# Patient Record
Sex: Female | Born: 1980 | Hispanic: Yes | Marital: Single | Smoking: Never smoker
Health system: Southern US, Community
[De-identification: ages and names within clinical notes are randomized; demographics above are authoritative.]

## PROBLEM LIST (undated history)

## (undated) HISTORY — PX: TUBAL LIGATION: SHX77

---
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
## 1999-01-02 ENCOUNTER — Encounter: Payer: Self-pay | Admitting: Obstetrics & Gynecology

## 2000-04-20 ENCOUNTER — Emergency Department (HOSPITAL_COMMUNITY): Admission: EM | Admit: 2000-04-20 | Discharge: 2000-04-21 | Payer: Self-pay | Admitting: *Deleted

## 2001-02-11 ENCOUNTER — Emergency Department (HOSPITAL_COMMUNITY): Admission: EM | Admit: 2001-02-11 | Discharge: 2001-02-11 | Payer: Self-pay | Admitting: *Deleted

## 2001-06-06 ENCOUNTER — Encounter: Payer: Self-pay | Admitting: Emergency Medicine

## 2001-06-06 ENCOUNTER — Emergency Department (HOSPITAL_COMMUNITY): Admission: EM | Admit: 2001-06-06 | Discharge: 2001-06-06 | Payer: Self-pay | Admitting: Emergency Medicine

## 2003-04-27 ENCOUNTER — Inpatient Hospital Stay (HOSPITAL_COMMUNITY): Admission: AD | Admit: 2003-04-27 | Discharge: 2003-04-27 | Payer: Self-pay | Admitting: Obstetrics & Gynecology

## 2009-05-20 ENCOUNTER — Emergency Department (HOSPITAL_COMMUNITY): Admission: EM | Admit: 2009-05-20 | Discharge: 2009-05-20 | Payer: Self-pay | Admitting: Family Medicine

## 2009-05-22 ENCOUNTER — Emergency Department (HOSPITAL_COMMUNITY): Admission: EM | Admit: 2009-05-22 | Discharge: 2009-05-22 | Payer: Self-pay | Admitting: Emergency Medicine

## 2009-10-27 ENCOUNTER — Emergency Department (HOSPITAL_COMMUNITY): Admission: EM | Admit: 2009-10-27 | Discharge: 2009-10-27 | Payer: Self-pay | Admitting: Family Medicine

## 2009-10-27 ENCOUNTER — Emergency Department (HOSPITAL_COMMUNITY): Admission: EM | Admit: 2009-10-27 | Discharge: 2009-10-27 | Payer: Self-pay | Admitting: Emergency Medicine

## 2009-10-28 ENCOUNTER — Encounter (INDEPENDENT_AMBULATORY_CARE_PROVIDER_SITE_OTHER): Payer: Self-pay | Admitting: *Deleted

## 2009-10-28 ENCOUNTER — Telehealth: Payer: Self-pay | Admitting: Internal Medicine

## 2009-10-29 ENCOUNTER — Emergency Department (HOSPITAL_COMMUNITY): Admission: EM | Admit: 2009-10-29 | Discharge: 2009-10-29 | Payer: Self-pay | Admitting: Emergency Medicine

## 2010-06-03 NOTE — Progress Notes (Signed)
Summary: Sooner appt  Phone Note Call from Patient Call back at [phone] CELL   Caller: Mom Summary of Call: pt was seen in [facility] ER last night for abdominal pain and vomiting blood. Pt was released.  Mother and pt are asking for sooner appt than Dr. Lamar Sprinkles first available on 12/02/09. Initial call taken by: Francee Piccolo CMA Duncan Dull),  October 28, 2009 9:43 AM  Follow-up for Phone Call        LM for pt to call.  Lupita Leash Surface RN  October 28, 2009 2:52 PM  Pt. contacted and she said she cx. appt. and is seeing another Dr. Follow-up by: Teryl Lucy RN,  October 29, 2009 8:44 AM

## 2010-06-03 NOTE — Letter (Signed)
Summary: New Patient letter  [facility]  [address] [place], [place] [address]   Phone: [phone]  Fax: [phone]       10/28/2009 MRN: [id number]  [facility] [address] [place], [place]  [address]  Dear Shannon Marks,  Welcome to the Gastroenterology Division at [facility].    You are scheduled to see Dr.  Marina Goodell on December 02, 2009 at 2:45 pm on the 3rd floor at [facility], [address]  We ask that you try to arrive at our office 15 minutes prior to your appointment time to allow for check-in.  We would like you to complete the enclosed self-administered evaluation form prior to your visit and bring it with you on the day of your appointment.  We will review it with you.  Also, please bring a complete list of all your medications or, if you prefer, bring the medication bottles and we will list them.  Please bring your insurance card so that we may make a copy of it.  If your insurance requires a referral to see a specialist, please bring your referral form from your primary care physician.  Co-payments are due at the time of your visit and may be paid by cash, check or credit card.     Your office visit will consist of a consult with your physician (includes a physical exam), any laboratory testing he/she may order, scheduling of any necessary diagnostic testing (e.g. x-ray, ultrasound, CT-scan), and scheduling of a procedure (e.g. Endoscopy, Colonoscopy) if required.  Please allow enough time on your schedule to allow for any/all of these possibilities.    If you cannot keep your appointment, please call [phone] to cancel or reschedule prior to your appointment date.  This allows [place] the opportunity to schedule an appointment for another patient in need of care.  If you do not cancel or reschedule by 5 p.m. the business day prior to your appointment date, you will be charged a $50.00 late cancellation/no-show fee.    Thank you for  choosing [facility] for your medical needs.  We appreciate the opportunity to care for you.  Please visit [place] at our website  to learn more about our practice.                     Sincerely,                                                             The Gastroenterology Division

## 2010-06-08 ENCOUNTER — Inpatient Hospital Stay (INDEPENDENT_AMBULATORY_CARE_PROVIDER_SITE_OTHER)
Admission: RE | Admit: 2010-06-08 | Discharge: 2010-06-08 | Disposition: A | Discharge status: Home / Self Care | Payer: Self-pay | Source: Ambulatory Visit | Attending: Emergency Medicine | Admitting: Emergency Medicine

## 2010-06-08 DIAGNOSIS — J019 Acute sinusitis, unspecified: Secondary | ICD-10-CM

## 2010-06-08 LAB — POCT RAPID STREP A (OFFICE): Streptococcus, Group A Screen (Direct): NEGATIVE

## 2010-06-11 ENCOUNTER — Inpatient Hospital Stay (INDEPENDENT_AMBULATORY_CARE_PROVIDER_SITE_OTHER)
Admission: RE | Admit: 2010-06-11 | Discharge: 2010-06-11 | Disposition: A | Discharge status: Home / Self Care | Payer: Self-pay | Source: Ambulatory Visit

## 2010-06-11 ENCOUNTER — Ambulatory Visit (INDEPENDENT_AMBULATORY_CARE_PROVIDER_SITE_OTHER): Payer: Self-pay

## 2010-06-11 DIAGNOSIS — J019 Acute sinusitis, unspecified: Secondary | ICD-10-CM

## 2010-07-02 ENCOUNTER — Inpatient Hospital Stay (INDEPENDENT_AMBULATORY_CARE_PROVIDER_SITE_OTHER)
Admission: RE | Admit: 2010-07-02 | Discharge: 2010-07-02 | Disposition: A | Discharge status: Home / Self Care | Payer: Self-pay | Source: Ambulatory Visit | Attending: Family Medicine | Admitting: Family Medicine

## 2010-07-02 ENCOUNTER — Emergency Department (HOSPITAL_COMMUNITY)
Admission: EM | Admit: 2010-07-02 | Discharge: 2010-07-02 | Disposition: A | Discharge status: Home / Self Care | Payer: Self-pay | Attending: Emergency Medicine | Admitting: Emergency Medicine

## 2010-07-02 ENCOUNTER — Emergency Department (HOSPITAL_COMMUNITY): Payer: Self-pay

## 2010-07-02 DIAGNOSIS — R221 Localized swelling, mass and lump, neck: Secondary | ICD-10-CM | POA: Insufficient documentation

## 2010-07-02 DIAGNOSIS — R220 Localized swelling, mass and lump, head: Secondary | ICD-10-CM | POA: Insufficient documentation

## 2010-07-02 DIAGNOSIS — R51 Headache: Secondary | ICD-10-CM | POA: Insufficient documentation

## 2010-07-02 DIAGNOSIS — J3489 Other specified disorders of nose and nasal sinuses: Secondary | ICD-10-CM | POA: Insufficient documentation

## 2010-07-18 LAB — COMPREHENSIVE METABOLIC PANEL
ALT: 27 U/L (ref 0–35)
Albumin: 4.3 g/dL (ref 3.5–5.2)
Alkaline Phosphatase: 51 U/L (ref 39–117)
BUN: 7 mg/dL (ref 6–23)
Calcium: 9.2 mg/dL (ref 8.4–10.5)
Creatinine, Ser: 0.72 mg/dL (ref 0.4–1.2)
Glucose, Bld: 93 mg/dL (ref 70–99)
Potassium: 3.8 mEq/L (ref 3.5–5.1)
Sodium: 138 mEq/L (ref 135–145)
Total Protein: 6.7 g/dL (ref 6.0–8.3)
Total Protein: 6.6 g/dL (ref 6.0–8.3)

## 2010-07-18 LAB — DIFFERENTIAL
Basophils Relative: 0 % (ref 0–1)
Eosinophils Absolute: 0 10*3/uL (ref 0.0–0.7)
Lymphocytes Relative: 30 % (ref 12–46)
Monocytes Absolute: 0.5 10*3/uL (ref 0.1–1.0)
Monocytes Absolute: 0.4 10*3/uL (ref 0.1–1.0)
Monocytes Relative: 7 % (ref 3–12)
Monocytes Relative: 5 % (ref 3–12)
Neutro Abs: 5.2 10*3/uL (ref 1.7–7.7)
Neutrophils Relative %: 80 % — ABNORMAL HIGH (ref 43–77)

## 2010-07-18 LAB — URINALYSIS, ROUTINE W REFLEX MICROSCOPIC
Ketones, ur: NEGATIVE mg/dL
Protein, ur: NEGATIVE mg/dL
Urobilinogen, UA: 0.2 mg/dL (ref 0.0–1.0)

## 2010-07-18 LAB — CBC
MCH: 31.5 pg (ref 26.0–34.0)
MCH: 31.8 pg (ref 26.0–34.0)
MCHC: 34.6 g/dL (ref 30.0–36.0)
MCHC: 34.4 g/dL (ref 30.0–36.0)
MCV: 91.1 fL (ref 78.0–100.0)
MCV: 92.3 fL (ref 78.0–100.0)
Platelets: 178 10*3/uL (ref 150–400)
RBC: 4.1 MIL/uL (ref 3.87–5.11)
RDW: 13.1 % (ref 11.5–15.5)
WBC: 8.2 10*3/uL (ref 4.0–10.5)

## 2010-07-18 LAB — HEMOCCULT GUIAC POC 1CARD (OFFICE): Fecal Occult Bld: NEGATIVE

## 2011-08-03 ENCOUNTER — Emergency Department (INDEPENDENT_AMBULATORY_CARE_PROVIDER_SITE_OTHER)
Admission: EM | Admit: 2011-08-03 | Discharge: 2011-08-03 | Disposition: A | Discharge status: Home / Self Care | Payer: Self-pay | Source: Home / Self Care | Attending: Emergency Medicine | Admitting: Emergency Medicine

## 2011-08-03 ENCOUNTER — Encounter (HOSPITAL_COMMUNITY): Payer: Self-pay

## 2011-08-03 DIAGNOSIS — H6120 Impacted cerumen, unspecified ear: Secondary | ICD-10-CM

## 2011-08-03 MED ORDER — DOCUSATE SODIUM 50 MG/5ML PO LIQD: 50.0000 mg | Freq: Once | ORAL | Status: DC

## 2011-08-03 NOTE — Discharge Instructions (Signed)
Go to www.goodrx.com to look up your medications. This will give you a list of where you can find your prescriptions at the most affordable prices.   RESOURCE GUIDE  No Primary Care Doctor Call [facility]  [phone] Other agencies that provide inexpensive medical care    [facility]  [phone]    [facility]  [phone]    [facility]  [phone]    [facility]  [phone] [place] [address]    [facility]  [phone]    [facility]  [phone] [facility] [phone]   [address] , [place] [address]  [place] [facility]  [facility] [place]     [employer]                          [facility] [address]                        [address]      [address]   [phone] (After Hours)   

## 2011-08-03 NOTE — ED Notes (Signed)
C/o hearing muffled right ear; on inspection, right eardrum obscured w cerumen, , left eardrum partly obscured; pt reportedly uses q-tips to clean ears, and had tried to clean w OTC ; phone order by MD to proceed w cleaning

## 2011-08-03 NOTE — ED Provider Notes (Signed)
History     CSN: [id number]  Arrival date & time 08/03/11  1024   First MD Initiated Contact with Patient 08/03/11 1109      Chief Complaint  Patient presents with  . Ear Fullness    (Consider location/radiation/quality/duration/timing/severity/associated sxs/prior treatment) HPI Comments: Patient reports bilaterally ear fullness, decreased hearing right ear starting several days ago. States she's been using Q-tips to try and clean her ears, but that this made the symptoms worse. Patient also tried peroxide and Debrox yesterday without improvement. Denies any other foreign body insertion. No otorrhea, nausea, vomiting, fevers, headache, sore throat, rash.  ROS as noted in HPI. All other ROS negative.   Patient is a 31 y.o. female presenting with plugged ear sensation. The history is provided by the patient. No language interpreter was used.  Ear Fullness This is a new problem. The current episode started more than 2 days ago. The problem occurs constantly. The problem has not changed since onset.Pertinent negatives include no headaches. The symptoms are aggravated by nothing. Treatments tried: Debrox. The treatment provided no relief.    History reviewed. No pertinent past medical history.  Past Surgical History  Procedure Date  . Tubal ligation     History reviewed. No pertinent family history.  History  Substance Use Topics  . Smoking status: Never Smoker   . Smokeless tobacco: Not on file  . Alcohol Use: No    OB History    Grav Para Term Preterm Abortions TAB SAB Ect Mult Living                  Review of Systems  Neurological: Negative for headaches.    Allergies  Review of patient's allergies indicates no known allergies.  Home Medications   Current Outpatient Rx  Name Route Sig Dispense Refill  . CARBAMIDE PEROXIDE 6.5 % OT SOLN  5 drops 2 (two) times daily.      BP 126/85  Pulse 62  Temp(Src) 99 F (37.2 C) (Oral)  Resp 10  SpO2 97%  Physical  Exam  Nursing note and vitals reviewed. Constitutional: She is oriented to person, place, and time. She appears well-developed and well-nourished. No distress.  HENT:  Head: Normocephalic and atraumatic.  Right Ear: Hearing, tympanic membrane, external ear and ear canal normal.  Left Ear: Hearing, tympanic membrane, external ear and ear canal normal.  Eyes: Conjunctivae and EOM are normal.  Neck: Normal range of motion.  Cardiovascular: Normal rate.   Pulmonary/Chest: Effort normal.  Abdominal: She exhibits no distension.  Musculoskeletal: Normal range of motion.  Neurological: She is alert and oriented to person, place, and time.  Skin: Skin is warm and dry.  Psychiatric: She has a normal mood and affect. Her behavior is normal. Judgment and thought content normal.    ED Course  Procedures (including critical care time)  Labs Reviewed - No data to display No results found.   1. Cerumen impaction       MDM  On reevaluation after irrigation, TMs intact, clear. External ear canal slightly irritated, but no significant sign of trauma or infection.  Luiz Blare, MD 08/03/11 1320

## 2011-09-23 ENCOUNTER — Encounter (HOSPITAL_COMMUNITY): Payer: Self-pay | Admitting: *Deleted

## 2011-09-23 ENCOUNTER — Emergency Department (INDEPENDENT_AMBULATORY_CARE_PROVIDER_SITE_OTHER)
Admission: EM | Admit: 2011-09-23 | Discharge: 2011-09-23 | Disposition: A | Discharge status: Home / Self Care | Payer: Self-pay | Source: Home / Self Care | Attending: Family Medicine | Admitting: Family Medicine

## 2011-09-23 DIAGNOSIS — H60399 Other infective otitis externa, unspecified ear: Secondary | ICD-10-CM

## 2011-09-23 DIAGNOSIS — H6093 Unspecified otitis externa, bilateral: Secondary | ICD-10-CM

## 2011-09-23 MED ORDER — CIPROFLOXACIN-HYDROCORTISONE 0.2-1 % OT SUSP: 3.0000 [drp] | Freq: Two times a day (BID) | OTIC | Status: AC

## 2011-09-23 MED ORDER — CEPHALEXIN 500 MG PO CAPS: 500.0000 mg | ORAL_CAPSULE | Freq: Four times a day (QID) | ORAL | Status: AC

## 2011-09-23 MED ORDER — HYDROCODONE-ACETAMINOPHEN 5-325 MG PO TABS: 1.0000 | ORAL_TABLET | ORAL | Status: AC | PRN

## 2011-09-23 NOTE — ED Provider Notes (Signed)
History     CSN: [id number]  Arrival date & time 09/23/11  0905   First MD Initiated Contact with Patient 09/23/11 [phone]      Chief Complaint  Patient presents with  . Otalgia    (Consider location/radiation/quality/duration/timing/severity/associated sxs/prior treatment) Patient is a 31 y.o. female presenting with ear pain. The history is provided by the patient.  Otalgia This is a new problem. The current episode started yesterday. There is pain in both ears. The problem has been gradually worsening. There has been no fever. The pain is moderate. Pertinent negatives include no ear discharge, no hearing loss and no rhinorrhea.    History reviewed. No pertinent past medical history.  Past Surgical History  Procedure Date  . Tubal ligation     History reviewed. No pertinent family history.  History  Substance Use Topics  . Smoking status: Never Smoker   . Smokeless tobacco: Not on file  . Alcohol Use: No    OB History    Grav Para Term Preterm Abortions TAB SAB Ect Mult Living                  Review of Systems  Constitutional: Negative.   HENT: Positive for ear pain. Negative for hearing loss, rhinorrhea, sneezing, postnasal drip, tinnitus and ear discharge.     Allergies  Review of patient's allergies indicates no known allergies.  Home Medications   Current Outpatient Rx  Name Route Sig Dispense Refill  . CARBAMIDE PEROXIDE 6.5 % OT SOLN  5 drops 2 (two) times daily.    . CEPHALEXIN 500 MG PO CAPS Oral Take 1 capsule (500 mg total) by mouth 4 (four) times daily. Take all of medicine and drink lots of fluids 20 capsule 0  . CIPROFLOXACIN-HYDROCORTISONE 0.2-1 % OT SUSP Both Ears Place 3 drops into both ears 2 (two) times daily. 10 mL 0  . HYDROCODONE-ACETAMINOPHEN 5-325 MG PO TABS Oral Take 1 tablet by mouth every 4 (four) hours as needed for pain. 6 tablet 0    BP 123/85  Pulse 62  Temp 98.2 F (36.8 C)  Resp 18  SpO2 100%  LMP 09/21/2011  Physical  Exam  Nursing note and vitals reviewed. Constitutional: She appears well-developed and well-nourished.  HENT:  Head: Normocephalic.  Right Ear: Tympanic membrane normal. There is tenderness. No drainage. Tympanic membrane is not injected.  Left Ear: Tympanic membrane normal. There is tenderness. No drainage. Tympanic membrane is not injected.  Nose: Nose normal.  Mouth/Throat: Oropharynx is clear and moist.    ED Course  Procedures (including critical care time)  Labs Reviewed - No data to display No results found.   1. Otitis externa of both ears       MDM          Linna Hoff, MD 09/23/11 [phone]

## 2011-09-23 NOTE — ED Notes (Signed)
Pt  Reports  l  Earache  Which  Started  Yesterday   She  Reports  The  Pain is  Causing  Her  A  Headache     She  denys  Any other  Symptoms

## 2012-03-26 ENCOUNTER — Encounter (HOSPITAL_COMMUNITY): Payer: Self-pay | Admitting: *Deleted

## 2012-03-26 ENCOUNTER — Emergency Department (INDEPENDENT_AMBULATORY_CARE_PROVIDER_SITE_OTHER)
Admission: EM | Admit: 2012-03-26 | Discharge: 2012-03-26 | Disposition: A | Discharge status: Home / Self Care | Payer: Self-pay | Source: Home / Self Care | Attending: Emergency Medicine | Admitting: Emergency Medicine

## 2012-03-26 DIAGNOSIS — J039 Acute tonsillitis, unspecified: Secondary | ICD-10-CM

## 2012-03-26 DIAGNOSIS — J0300 Acute streptococcal tonsillitis, unspecified: Secondary | ICD-10-CM

## 2012-03-26 DIAGNOSIS — J020 Streptococcal pharyngitis: Secondary | ICD-10-CM

## 2012-03-26 LAB — POCT RAPID STREP A: Streptococcus, Group A Screen (Direct): POSITIVE — AB

## 2012-03-26 MED ORDER — ACETAMINOPHEN-CODEINE #4 300-60 MG PO TABS: 1.0000 | ORAL_TABLET | ORAL | Status: DC | PRN

## 2012-03-26 MED ORDER — AMOXICILLIN 500 MG PO CAPS: 500.0000 mg | ORAL_CAPSULE | Freq: Three times a day (TID) | ORAL | Status: AC

## 2012-03-26 NOTE — ED Provider Notes (Signed)
History     CSN: [id number]  Arrival date & time 03/26/12  0804   First MD Initiated Contact with Patient 03/26/12 [phone]      Chief Complaint  Patient presents with  . Sore Throat    (Consider location/radiation/quality/duration/timing/severity/associated sxs/prior treatment) HPI Comments: Patient presents urgent care this morning complaining of severe sore throat. She feels her glands on her neck are swollen and tender at touch. She had a fever yesterday of 101. She has multiple white spots in the back of her throat and she reports seen at on a mirror. She works at [employer] and is exposed to multiple clients she describes. He feels somewhat without appetite, with discomforting sore throat. Patient denies any coughing or nasal congestion. Denies any abdominal pain nausea or vomiting. No newly developed rashes.  Patient is a 31 y.o. female presenting with pharyngitis. The history is provided by the patient.  Sore Throat This is a new problem. The problem occurs constantly. The problem has been gradually worsening. Pertinent negatives include no headaches and no shortness of breath. The symptoms are aggravated by swallowing. Nothing relieves the symptoms. The treatment provided no relief.    No past medical history on file.  Past Surgical History  Procedure Date  . Tubal ligation     No family history on file.  History  Substance Use Topics  . Smoking status: Never Smoker   . Smokeless tobacco: Not on file  . Alcohol Use: No    OB History    Grav Para Term Preterm Abortions TAB SAB Ect Mult Living                  Review of Systems  Constitutional: Positive for fever, chills, activity change and fatigue.  HENT: Positive for ear pain, sore throat and neck pain. Negative for congestion, facial swelling, rhinorrhea, drooling, mouth sores, neck stiffness and tinnitus.   Respiratory: Negative for cough, shortness of breath and wheezing.   Musculoskeletal: Negative for back  pain and arthralgias.  Skin: Negative for rash.  Neurological: Negative for dizziness and headaches.    Allergies  Review of patient's allergies indicates no known allergies.  Home Medications   Current Outpatient Rx  Name  Route  Sig  Dispense  Refill  . CARBAMIDE PEROXIDE 6.5 % OT SOLN      5 drops 2 (two) times daily.           BP 118/72  Pulse 80  Temp 99.7 F (37.6 C) (Oral)  Resp 16  SpO2 99%  Physical Exam  Nursing note and vitals reviewed. Constitutional: She appears distressed.  HENT:  Head: Normocephalic.  Right Ear: Hearing and tympanic membrane normal. No drainage. No mastoid tenderness. Tympanic membrane is not injected, not perforated, not erythematous and not retracted.  Left Ear: Hearing and tympanic membrane normal. No drainage. No mastoid tenderness. Tympanic membrane is not injected, not perforated, not erythematous and not retracted.  Mouth/Throat: Uvula is midline and mucous membranes are normal. Oropharyngeal exudate and posterior oropharyngeal erythema present. No tonsillar abscesses.  Eyes: Conjunctivae normal are normal.  Neck: Neck supple. No JVD present. No tracheal deviation present. No thyromegaly present.  Cardiovascular: Normal rate.  Exam reveals no gallop.   No murmur heard. Pulmonary/Chest: Effort normal and breath sounds normal.  Lymphadenopathy:    She has cervical adenopathy.  Skin: Skin is warm. No rash noted.    ED Course  Procedures (including critical care time)   Labs Reviewed  POCT RAPID STREP  A ([facility] ONLY)   No results found.   No diagnosis found.    MDM   Tonsillitis. Pustular appearance. Marked reactive lymphadenopathy is. We are performing rapid strep will treat with antibiotics. Patient will be provided with a prescription of amoxicillin for 10 days and 15 tablets of Tylenol #3.       Jimmie Molly, MD 03/26/12 [phone]

## 2012-03-26 NOTE — ED Notes (Signed)
Pt  Reports  Symptoms  Of  sorethroat           And  Bilateral  Earache   Symptoms     X  sev  Days    Pt  Reports  Her  Lymph  Nodes    Seem  Swollen                She  Appears  Not  To  Feel  Well

## 2013-12-11 ENCOUNTER — Encounter (HOSPITAL_COMMUNITY): Payer: Self-pay | Admitting: Emergency Medicine

## 2013-12-11 ENCOUNTER — Emergency Department (INDEPENDENT_AMBULATORY_CARE_PROVIDER_SITE_OTHER)
Admission: EM | Admit: 2013-12-11 | Discharge: 2013-12-11 | Disposition: A | Discharge status: Home / Self Care | Payer: Self-pay | Source: Home / Self Care | Attending: Family Medicine | Admitting: Family Medicine

## 2013-12-11 DIAGNOSIS — K0501 Acute gingivitis, non-plaque induced: Secondary | ICD-10-CM

## 2013-12-11 MED ORDER — TRIAMCINOLONE ACETONIDE 0.1 % MT PSTE: PASTE | OROMUCOSAL | Status: DC

## 2013-12-11 MED ORDER — CHLORHEXIDINE GLUCONATE 0.12 % MT SOLN: 15.0000 mL | Freq: Four times a day (QID) | OROMUCOSAL | Status: DC

## 2013-12-11 NOTE — ED Notes (Signed)
Pt c/o mouth lesions since last night Pain is 10/10 Alert w/no signs of acute distress.

## 2013-12-11 NOTE — Discharge Instructions (Signed)
See your dentist if further problems. °

## 2013-12-11 NOTE — ED Provider Notes (Signed)
CSN: [id number]     Arrival date & time 12/11/13  1829 History   First MD Initiated Contact with Patient 12/11/13 1919     Chief Complaint  Patient presents with  . Mouth Lesions   (Consider location/radiation/quality/duration/timing/severity/associated sxs/prior Treatment) Patient is a 33 y.o. female presenting with mouth sores. The history is provided by the patient.  Mouth Lesions Location:  Upper gingiva and lower gingiva Upper gingiva location:  L buccal and R buccal Lower gingiva location:  L buccal and R buccal Quality:  Blistered, painful and ulcerous Pain details:    Quality:  Sore   Severity:  Mild   Duration:  1 day   Progression:  Worsening Onset quality:  Sudden Severity:  Moderate Chronicity:  New Relieved by:  None tried Worsened by:  Nothing tried Ineffective treatments:  None tried Associated symptoms: fever   Associated symptoms: no dental pain, no rash, no sore throat and no swollen glands     History reviewed. No pertinent past medical history. Past Surgical History  Procedure Laterality Date  . Tubal ligation     No family history on file. History  Substance Use Topics  . Smoking status: Never Smoker   . Smokeless tobacco: Not on file  . Alcohol Use: No   OB History   Grav Para Term Preterm Abortions TAB SAB Ect Mult Living                 Review of Systems  Constitutional: Positive for fever.  HENT: Positive for mouth sores. Negative for dental problem, facial swelling, sore throat and trouble swallowing.   Skin: Negative for rash.    Allergies  Review of patient's allergies indicates no known allergies.  Home Medications   Prior to Admission medications   Medication Sig Start Date End Date Taking? Authorizing Provider  acetaminophen-codeine (TYLENOL #4) 300-60 MG per tablet Take 1 tablet by mouth every 4 (four) hours as needed for pain. 03/26/12   Jimmie MollyPaolo Coll, MD  carbamide peroxide (DEBROX) 6.5 % otic solution 5 drops 2 (two) times  daily.    Historical Provider, MD  chlorhexidine (PERIDEX) 0.12 % solution Use as directed 15 mLs in the mouth or throat 4 (four) times daily. Swish thoroughly and spit. 12/11/13   Linna HoffJames D Jamarii Banks, MD  triamcinolone (KENALOG) 0.1 % paste Use on gums qid 12/11/13   Linna HoffJames D Tanaja Ganger, MD   BP 141/93  Pulse 70  Temp(Src) 100.2 F (37.9 C) (Oral)  Resp 12  SpO2 98%  LMP 12/07/2013 Physical Exam  Nursing note and vitals reviewed. Constitutional: She appears well-developed and well-nourished. She appears distressed.  HENT:  Tender blistering upper and lower gingivitis bilat, post pharynx nl , no swallowing diff. , no adenopathy.  Neck: Normal range of motion. Neck supple.  Lymphadenopathy:    She has no cervical adenopathy.    ED Course  Procedures (including critical care time) Labs Review Labs Reviewed - No data to display  Imaging Review No results found.   MDM   1. Acute gingivitis, non-plaque induced        Linna HoffJames D Danish Ruffins, MD 12/11/13 1935

## 2014-05-31 ENCOUNTER — Emergency Department (HOSPITAL_COMMUNITY)
Admission: EM | Admit: 2014-05-31 | Discharge: 2014-05-31 | Disposition: A | Discharge status: Home / Self Care | Payer: Self-pay | Attending: Emergency Medicine | Admitting: Emergency Medicine

## 2014-05-31 ENCOUNTER — Encounter (HOSPITAL_COMMUNITY): Payer: Self-pay

## 2014-05-31 DIAGNOSIS — G4489 Other headache syndrome: Secondary | ICD-10-CM | POA: Insufficient documentation

## 2014-05-31 DIAGNOSIS — Z79899 Other long term (current) drug therapy: Secondary | ICD-10-CM | POA: Insufficient documentation

## 2014-05-31 MED ORDER — METOCLOPRAMIDE HCL 5 MG/ML IJ SOLN
10.0000 mg | Freq: Once | INTRAMUSCULAR | Status: AC
  Administered 2014-05-31: 10 mg via INTRAVENOUS
  Filled 2014-05-31: qty 2

## 2014-05-31 MED ORDER — HYDROMORPHONE HCL 1 MG/ML IJ SOLN
1.0000 mg | Freq: Once | INTRAMUSCULAR | Status: AC
  Administered 2014-05-31: 1 mg via INTRAVENOUS
  Filled 2014-05-31: qty 1

## 2014-05-31 NOTE — Discharge Instructions (Signed)
Headaches, Frequently Asked Questions Call the wellness center to get a primary care physician. No driving for the next 24 hours as you were given medication today which can cause drowsiness. MIGRAINE HEADACHES Q: What is migraine? What causes it? How can I treat it? A: Generally, migraine headaches begin as a dull ache. Then they develop into a constant, throbbing, and pulsating pain. You may experience pain at the temples. You may experience pain at the front or back of one or both sides of the head. The pain is usually accompanied by a combination of:  Nausea.  Vomiting.  Sensitivity to light and noise. Some people (about 15%) experience an aura (see below) before an attack. The cause of migraine is believed to be chemical reactions in the brain. Treatment for migraine may include over-the-counter or prescription medications. It may also include self-help techniques. These include relaxation training and biofeedback.  Q: What is an aura? A: About 15% of people with migraine get an "aura". This is a sign of neurological symptoms that occur before a migraine headache. You may see wavy or jagged lines, dots, or flashing lights. You might experience tunnel vision or blind spots in one or both eyes. The aura can include visual or auditory hallucinations (something imagined). It may include disruptions in smell (such as strange odors), taste or touch. Other symptoms include:  Numbness.  A "pins and needles" sensation.  Difficulty in recalling or speaking the correct word. These neurological events may last as long as 60 minutes. These symptoms will fade as the headache begins. Q: What is a trigger? A: Certain physical or environmental factors can lead to or "trigger" a migraine. These include:  Foods.  Hormonal changes.  Weather.  Stress. It is important to remember that triggers are different for everyone. To help prevent migraine attacks, you need to figure out which triggers affect  you. Keep a headache diary. This is a good way to track triggers. The diary will help you talk to your healthcare professional about your condition. Q: Does weather affect migraines? A: Bright sunshine, hot, humid conditions, and drastic changes in barometric pressure may lead to, or "trigger," a migraine attack in some people. But studies have shown that weather does not act as a trigger for everyone with migraines. Q: What is the link between migraine and hormones? A: Hormones start and regulate many of your body's functions. Hormones keep your body in balance within a constantly changing environment. The levels of hormones in your body are unbalanced at times. Examples are during menstruation, pregnancy, or menopause. That can lead to a migraine attack. In fact, about three quarters of all women with migraine report that their attacks are related to the menstrual cycle.  Q: Is there an increased risk of stroke for migraine sufferers? A: The likelihood of a migraine attack causing a stroke is very remote. That is not to say that migraine sufferers cannot have a stroke associated with their migraines. In persons under age 20, the most common associated factor for stroke is migraine headache. But over the course of a person's normal life span, the occurrence of migraine headache may actually be associated with a reduced risk of dying from cerebrovascular disease due to stroke.  Q: What are acute medications for migraine? A: Acute medications are used to treat the pain of the headache after it has started. Examples over-the-counter medications, NSAIDs, ergots, and triptans.  Q: What are the triptans? A: Triptans are the newest class of abortive medications. They  are specifically targeted to treat migraine. Triptans are vasoconstrictors. They moderate some chemical reactions in the brain. The triptans work on receptors in your brain. Triptans help to restore the balance of a neurotransmitter called  serotonin. Fluctuations in levels of serotonin are thought to be a main cause of migraine.  Q: Are over-the-counter medications for migraine effective? A: Over-the-counter, or "OTC," medications may be effective in relieving mild to moderate pain and associated symptoms of migraine. But you should see your caregiver before beginning any treatment regimen for migraine.  Q: What are preventive medications for migraine? A: Preventive medications for migraine are sometimes referred to as "prophylactic" treatments. They are used to reduce the frequency, severity, and length of migraine attacks. Examples of preventive medications include antiepileptic medications, antidepressants, beta-blockers, calcium channel blockers, and NSAIDs (nonsteroidal anti-inflammatory drugs). Q: Why are anticonvulsants used to treat migraine? A: During the past few years, there has been an increased interest in antiepileptic drugs for the prevention of migraine. They are sometimes referred to as "anticonvulsants". Both epilepsy and migraine may be caused by similar reactions in the brain.  Q: Why are antidepressants used to treat migraine? A: Antidepressants are typically used to treat people with depression. They may reduce migraine frequency by regulating chemical levels, such as serotonin, in the brain.  Q: What alternative therapies are used to treat migraine? A: The term "alternative therapies" is often used to describe treatments considered outside the scope of conventional Western medicine. Examples of alternative therapy include acupuncture, acupressure, and yoga. Another common alternative treatment is herbal therapy. Some herbs are believed to relieve headache pain. Always discuss alternative therapies with your caregiver before proceeding. Some herbal products contain arsenic and other toxins. TENSION HEADACHES Q: What is a tension-type headache? What causes it? How can I treat it? A: Tension-type headaches occur  randomly. They are often the result of temporary stress, anxiety, fatigue, or anger. Symptoms include soreness in your temples, a tightening band-like sensation around your head (a "vice-like" ache). Symptoms can also include a pulling feeling, pressure sensations, and contracting head and neck muscles. The headache begins in your forehead, temples, or the back of your head and neck. Treatment for tension-type headache may include over-the-counter or prescription medications. Treatment may also include self-help techniques such as relaxation training and biofeedback. CLUSTER HEADACHES Q: What is a cluster headache? What causes it? How can I treat it? A: Cluster headache gets its name because the attacks come in groups. The pain arrives with little, if any, warning. It is usually on one side of the head. A tearing or bloodshot eye and a runny nose on the same side of the headache may also accompany the pain. Cluster headaches are believed to be caused by chemical reactions in the brain. They have been described as the most severe and intense of any headache type. Treatment for cluster headache includes prescription medication and oxygen. SINUS HEADACHES Q: What is a sinus headache? What causes it? How can I treat it? A: When a cavity in the bones of the face and skull (a sinus) becomes inflamed, the inflammation will cause localized pain. This condition is usually the result of an allergic reaction, a tumor, or an infection. If your headache is caused by a sinus blockage, such as an infection, you will probably have a fever. An x-ray will confirm a sinus blockage. Your caregiver's treatment might include antibiotics for the infection, as well as antihistamines or decongestants.  REBOUND HEADACHES Q: What is a rebound headache?  What causes it? How can I treat it? A: A pattern of taking acute headache medications too often can lead to a condition known as "rebound headache." A pattern of taking too much  headache medication includes taking it more than 2 days per week or in excessive amounts. That means more than the label or a caregiver advises. With rebound headaches, your medications not only stop relieving pain, they actually begin to cause headaches. Doctors treat rebound headache by tapering the medication that is being overused. Sometimes your caregiver will gradually substitute a different type of treatment or medication. Stopping may be a challenge. Regularly overusing a medication increases the potential for serious side effects. Consult a caregiver if you regularly use headache medications more than 2 days per week or more than the label advises. ADDITIONAL QUESTIONS AND ANSWERS Q: What is biofeedback? A: Biofeedback is a self-help treatment. Biofeedback uses special equipment to monitor your body's involuntary physical responses. Biofeedback monitors:  Breathing.  Pulse.  Heart rate.  Temperature.  Muscle tension.  Brain activity. Biofeedback helps you refine and perfect your relaxation exercises. You learn to control the physical responses that are related to stress. Once the technique has been mastered, you do not need the equipment any more. Q: Are headaches hereditary? A: Four out of five (80%) of people that suffer report a family history of migraine. Scientists are not sure if this is genetic or a family predisposition. Despite the uncertainty, a child has a 50% chance of having migraine if one parent suffers. The child has a 75% chance if both parents suffer.  Q: Can children get headaches? A: By the time they reach high school, most young people have experienced some type of headache. Many safe and effective approaches or medications can prevent a headache from occurring or stop it after it has begun.  Q: What type of doctor should I see to diagnose and treat my headache? A: Start with your primary caregiver. Discuss his or her experience and approach to headaches. Discuss  methods of classification, diagnosis, and treatment. Your caregiver may decide to recommend you to a headache specialist, depending upon your symptoms or other physical conditions. Having diabetes, allergies, etc., may require a more comprehensive and inclusive approach to your headache. The National Head[employer] Foundation will provide, upon request, a list of [facility] physician members in your state. Document Released: 07/09/2003 Document Revised: 07/11/2011 Document Reviewed: 12/17/2007 [facility] Patient Information 2015 [place], [place]. This information is not intended to replace advice given to you by your health care provider. Make sure you discuss any questions you have with your health care provider.

## 2014-05-31 NOTE — ED Notes (Signed)
Pt sts while she was getting dressed, she became dizzy. Pt laid back down. Pt advised to stay in bed until her ride gets here and we will assist her outside

## 2014-05-31 NOTE — ED Notes (Signed)
Dr Shela CommonsJ at pt bedside, reports that pt is clear to leave

## 2014-05-31 NOTE — ED Notes (Signed)
Informed Darl PikesSusan, RN that patient's bp was lower than when she came in, advised to do a manual bp. Was unable to hear systoloc. Darl PikesSusan asked to go into room

## 2014-05-31 NOTE — ED Notes (Signed)
Patient c/o temporal headache that radiates into posterior neck and right shoulder since last night. Patient states she vomited x 1 prior to arriving to the ED.  Patient denies photophobia.

## 2014-05-31 NOTE — ED Notes (Signed)
Pt sts that she is still dizzy from meds. Dr Shela CommonsJ notified and sts that pt is reacting to narcotics and is clear to go home with supervision. Pt is A&O and in NAD

## 2014-05-31 NOTE — ED Notes (Signed)
MD at bedside. 

## 2014-05-31 NOTE — ED Provider Notes (Addendum)
CSN: [id number]     Arrival date & time 05/31/14  4098 History   First MD Initiated Contact with Patient 05/31/14 [phone]     Chief Complaint  Patient presents with  . Headache     (Consider location/radiation/quality/duration/timing/severity/associated sxs/prior Treatment) HPI Complains of headache onset gradually last night bitemporal radiating to her occiput. No neck pain no shoulder pain she vomited one time on arrival here and no photophobia no fever. She treated herself with Tylenol and Motrin without relief. Patient gets headaches once per month for the past several years usually with her menses however not as severe as this one. Last normal menstrual period approximately 2 weeks ago. No fever no photophobia no other associated symptoms. Pain is worse with movement improved with remaining still. No past medical history on file. Past Surgical History  Procedure Laterality Date  . Tubal ligation     No family history on file. History  Substance Use Topics  . Smoking status: Never Smoker   . Smokeless tobacco: Not on file  . Alcohol Use: No   no illicit drug use OB History    No data available     Review of Systems  Constitutional: Negative.   Respiratory: Negative.   Cardiovascular: Negative.   Gastrointestinal: Positive for nausea and vomiting.  Musculoskeletal: Negative.   Skin: Negative.   Neurological: Positive for headaches.  Psychiatric/Behavioral: Negative.   All other systems reviewed and are negative.     Allergies  Review of patient's allergies indicates no known allergies.  Home Medications   Prior to Admission medications   Medication Sig Start Date End Date Taking? Authorizing Provider  acetaminophen-codeine (TYLENOL #4) 300-60 MG per tablet Take 1 tablet by mouth every 4 (four) hours as needed for pain. 03/26/12   Jimmie Molly, MD  carbamide peroxide (DEBROX) 6.5 % otic solution 5 drops 2 (two) times daily.    Historical Provider, MD  chlorhexidine  (PERIDEX) 0.12 % solution Use as directed 15 mLs in the mouth or throat 4 (four) times daily. Swish thoroughly and spit. 12/11/13   Linna Hoff, MD  triamcinolone (KENALOG) 0.1 % paste Use on gums qid 12/11/13   Linna Hoff, MD   There were no vitals taken for this visit. Physical Exam  Constitutional: She is oriented to person, place, and time. She appears well-developed and well-nourished. She appears distressed.  Appears uncomfortable Glasgow Coma Score 15  HENT:  Head: Normocephalic and atraumatic.  Eyes: Conjunctivae are normal. Pupils are equal, round, and reactive to light.  Fundi benign  Neck: Neck supple. No tracheal deviation present. No thyromegaly present.  No signs of meningitis  Cardiovascular: Normal rate and regular rhythm.   No murmur heard. Pulmonary/Chest: Effort normal and breath sounds normal.  Abdominal: Soft. Bowel sounds are normal. She exhibits no distension. There is no tenderness.  Musculoskeletal: Normal range of motion. She exhibits no edema or tenderness.  Neurological: She is alert and oriented to person, place, and time. She has normal reflexes. No cranial nerve deficit. Coordination normal.  Gait normal Romberg normal pronator drift normal finger to nose normal  Skin: Skin is warm and dry. No rash noted.  Psychiatric: She has a normal mood and affect.  Nursing note and vitals reviewed.   ED Course  Procedures (including critical care time) Labs Review Labs Reviewed - No data to display  Imaging Review No results found.   EKG Interpretation None     8:55 AM reports headache is improved, not gone  after treatment with intravenous Reglan.. Nausea has resolved. She appears more comfortable alert Glasgow Coma Score 15. Intravenous hydromorphone ordered. 9:18 AM patient feels much improved and ready to go home. She is alert ambulatory without difficulty. Not lightheaded on standing MDM  Doubt serious etiology of headache. No meningeal signs. Gradual  onset headache. Plan referral wellness Center Dx Headache  Final diagnoses:  None        Doug SouSam Shaniece Bussa, MD 05/31/14 [phone]  Patient noted to be mildly bradycardic on discharge. She reported to the nurse that she was mildly lightheaded. I checked on her. She stated that lightheadedness was gradually diminishing. Most likely secondary to medication. Patient was steady on her feet ambulating.  Doug SouSam Quilla Freeze, MD 05/31/14 1124

## 2014-06-01 ENCOUNTER — Emergency Department (HOSPITAL_COMMUNITY)
Admission: EM | Admit: 2014-06-01 | Discharge: 2014-06-01 | Disposition: A | Discharge status: Home / Self Care | Payer: Self-pay | Attending: Emergency Medicine | Admitting: Emergency Medicine

## 2014-06-01 ENCOUNTER — Emergency Department (HOSPITAL_COMMUNITY): Payer: Self-pay

## 2014-06-01 ENCOUNTER — Encounter (HOSPITAL_COMMUNITY): Payer: Self-pay | Admitting: Emergency Medicine

## 2014-06-01 DIAGNOSIS — G529 Cranial nerve disorder, unspecified: Secondary | ICD-10-CM | POA: Insufficient documentation

## 2014-06-01 DIAGNOSIS — R51 Headache: Secondary | ICD-10-CM | POA: Insufficient documentation

## 2014-06-01 DIAGNOSIS — R519 Headache, unspecified: Secondary | ICD-10-CM

## 2014-06-01 DIAGNOSIS — Z79899 Other long term (current) drug therapy: Secondary | ICD-10-CM | POA: Insufficient documentation

## 2014-06-01 LAB — I-STAT CHEM 8, ED
BUN: 14 mg/dL (ref 6–23)
CREATININE: 0.6 mg/dL (ref 0.50–1.10)
Calcium, Ion: 1.13 mmol/L (ref 1.12–1.23)
Chloride: 106 mmol/L (ref 96–112)
Glucose, Bld: 93 mg/dL (ref 70–99)
HCT: 35 % — ABNORMAL LOW (ref 36.0–46.0)
Hemoglobin: 11.9 g/dL — ABNORMAL LOW (ref 12.0–15.0)
Potassium: 3.6 mmol/L (ref 3.5–5.1)
Sodium: 139 mmol/L (ref 135–145)
TCO2: 20 mmol/L (ref 0–100)

## 2014-06-01 MED ORDER — PROMETHAZINE HCL 25 MG PO TABS: 25.0000 mg | ORAL_TABLET | Freq: Four times a day (QID) | ORAL | Status: DC | PRN

## 2014-06-01 MED ORDER — PREDNISONE 10 MG PO TABS: 20.0000 mg | ORAL_TABLET | Freq: Every day | ORAL | Status: DC

## 2014-06-01 MED ORDER — SODIUM CHLORIDE 0.9 % IV BOLUS (SEPSIS)
1000.0000 mL | Freq: Once | INTRAVENOUS | Status: AC
  Administered 2014-06-01: 1000 mL via INTRAVENOUS

## 2014-06-01 MED ORDER — DIPHENHYDRAMINE HCL 50 MG/ML IJ SOLN
25.0000 mg | Freq: Once | INTRAMUSCULAR | Status: AC
  Administered 2014-06-01: 25 mg via INTRAVENOUS
  Filled 2014-06-01: qty 1

## 2014-06-01 MED ORDER — METOCLOPRAMIDE HCL 5 MG/ML IJ SOLN
10.0000 mg | Freq: Once | INTRAMUSCULAR | Status: AC
  Administered 2014-06-01: 10 mg via INTRAVENOUS
  Filled 2014-06-01: qty 2

## 2014-06-01 MED ORDER — ONDANSETRON HCL 4 MG/2ML IJ SOLN
4.0000 mg | Freq: Once | INTRAMUSCULAR | Status: AC
  Administered 2014-06-01: 4 mg via INTRAVENOUS
  Filled 2014-06-01: qty 2

## 2014-06-01 MED ORDER — HYDROMORPHONE HCL 1 MG/ML IJ SOLN
1.0000 mg | Freq: Once | INTRAMUSCULAR | Status: AC
  Administered 2014-06-01: 1 mg via INTRAVENOUS
  Filled 2014-06-01: qty 1

## 2014-06-01 MED ORDER — TRAMADOL HCL 50 MG PO TABS: 50.0000 mg | ORAL_TABLET | Freq: Four times a day (QID) | ORAL | Status: DC | PRN

## 2014-06-01 MED ORDER — KETOROLAC TROMETHAMINE 30 MG/ML IJ SOLN
30.0000 mg | Freq: Once | INTRAMUSCULAR | Status: AC
  Administered 2014-06-01: 30 mg via INTRAVENOUS
  Filled 2014-06-01: qty 1

## 2014-06-01 MED ORDER — PREDNISONE 20 MG PO TABS
60.0000 mg | ORAL_TABLET | Freq: Once | ORAL | Status: AC
  Administered 2014-06-01: 60 mg via ORAL
  Filled 2014-06-01: qty 3

## 2014-06-01 NOTE — ED Notes (Signed)
Pt awake, alert at this time, asking for something to drink, pt reports pain "still 10/10", neuros grossly intact.  Family at bedside.  NAD.

## 2014-06-01 NOTE — Discharge Instructions (Signed)
Rest at home 1-2 days and follow up if any problems

## 2014-06-01 NOTE — ED Notes (Addendum)
Per pt, head pain since Fri-states she was here yesterday for same thing-states when she got home it started hurting again-no trauma/injury to head

## 2014-06-01 NOTE — ED Notes (Signed)
Pt to CT

## 2014-06-01 NOTE — ED Notes (Signed)
Pt continues to c/o pain, reports no better after medication, asking for something to eat and asking when she will be d/c'd.  Pt informed still waiting for CT results at this time.  NAD.

## 2014-06-01 NOTE — ED Provider Notes (Signed)
CSN: [id number]     Arrival date & time 06/01/14  0847 History   First MD Initiated Contact with Patient 06/01/14 [phone]     Chief Complaint  Patient presents with  . Headache     (Consider location/radiation/quality/duration/timing/severity/associated sxs/prior Treatment) Patient is a 34 y.o. female presenting with headaches. The history is provided by the patient (the pt complains of a headache,  no fever).  Headache Pain location:  Generalized Quality:  Dull Radiates to:  Does not radiate Severity currently:  8/10 Severity at highest:  9/10 Onset quality:  Gradual Timing:  Constant Progression:  Waxing and waning Chronicity:  New Context: not activity   Associated symptoms: no abdominal pain, no back pain, no congestion, no cough, no diarrhea, no fatigue, no seizures and no sinus pressure     History reviewed. No pertinent past medical history. Past Surgical History  Procedure Laterality Date  . Tubal ligation     Family History  Problem Relation Age of Onset  . Family history unknown: Yes   History  Substance Use Topics  . Smoking status: Never Smoker   . Smokeless tobacco: Not on file  . Alcohol Use: No   OB History    No data available     Review of Systems  Constitutional: Negative for appetite change and fatigue.  HENT: Negative for congestion, ear discharge and sinus pressure.   Eyes: Negative for discharge.  Respiratory: Negative for cough.   Cardiovascular: Negative for chest pain.  Gastrointestinal: Negative for abdominal pain and diarrhea.  Genitourinary: Negative for frequency and hematuria.  Musculoskeletal: Negative for back pain.  Skin: Negative for rash.  Neurological: Positive for headaches. Negative for seizures.  Psychiatric/Behavioral: Negative for hallucinations.      Allergies  Review of patient's allergies indicates no known allergies.  Home Medications   Prior to Admission medications   Medication Sig Start Date End Date  Taking? Authorizing Provider  acetaminophen (TYLENOL) 500 MG tablet Take 1,000 mg by mouth every 6 (six) hours as needed for mild pain or headache.   Yes Historical Provider, MD  Multiple Vitamin (MULTIVITAMIN WITH MINERALS) TABS tablet Take 1 tablet by mouth daily.   Yes Historical Provider, MD  acetaminophen-codeine (TYLENOL #4) 300-60 MG per tablet Take 1 tablet by mouth every 4 (four) hours as needed for pain. Patient not taking: Reported on 05/31/2014 03/26/12   Jimmie Molly, MD  chlorhexidine (PERIDEX) 0.12 % solution Use as directed 15 mLs in the mouth or throat 4 (four) times daily. Swish thoroughly and spit. Patient not taking: Reported on 05/31/2014 12/11/13   Linna Hoff, MD  predniSONE (DELTASONE) 10 MG tablet Take 2 tablets (20 mg total) by mouth daily. 06/01/14   Benny Lennert, MD  traMADol (ULTRAM) 50 MG tablet Take 1 tablet (50 mg total) by mouth every 6 (six) hours as needed. 06/01/14   Benny Lennert, MD  triamcinolone (KENALOG) 0.1 % paste Use on gums qid Patient not taking: Reported on 05/31/2014 12/11/13   Linna Hoff, MD   BP 105/65 mmHg  Pulse 103  Temp(Src) 97.7 F (36.5 C) (Oral)  Resp 18  SpO2 99%  LMP 05/23/2014 Physical Exam  Constitutional: She is oriented to person, place, and time. She appears well-developed.  HENT:  Head: Normocephalic and atraumatic.  Right Ear: External ear normal.  Left Ear: External ear normal.  Neck supple  Eyes: Conjunctivae and EOM are normal. No scleral icterus.  Neck: Neck supple. No thyromegaly present.  Cardiovascular:  Normal rate and regular rhythm.  Exam reveals no gallop and no friction rub.   No murmur heard. Pulmonary/Chest: No stridor. She has no wheezes. She has no rales. She exhibits no tenderness.  Abdominal: She exhibits no distension. There is no tenderness. There is no rebound.  Musculoskeletal: Normal range of motion. She exhibits no edema.  Lymphadenopathy:    She has no cervical adenopathy.  Neurological: She  is alert and oriented to person, place, and time. A cranial nerve deficit is present. She exhibits normal muscle tone. Coordination normal.  Skin: No rash noted. No erythema.  Psychiatric: She has a normal mood and affect. Her behavior is normal.    ED Course  Procedures (including critical care time) Labs Review Labs Reviewed  I-STAT CHEM 8, ED - Abnormal; Notable for the following:    Hemoglobin 11.9 (*)    HCT 35.0 (*)    All other components within normal limits    Imaging Review Ct Head Wo Contrast  06/01/2014   CLINICAL DATA:  Acute headache.  No history of injury.  EXAM: CT HEAD WITHOUT CONTRAST  TECHNIQUE: Contiguous axial images were obtained from the base of the skull through the vertex without intravenous contrast.  COMPARISON:  CT scan of July 02, 2010.  FINDINGS: Bony calvarium appears intact. No mass effect or midline shift is noted. Ventricular size is within normal limits. There is no evidence of mass lesion, hemorrhage or acute infarction.  IMPRESSION: Normal head CT.   Electronically Signed   By: Roque LiasJames  Green M.D.   On: 06/01/2014 13:21     EKG Interpretation None      MDM   Final diagnoses:  Headache behind the eyes    Headache,   Stress related.  Will tx with prednisone and ultram     Benny LennertJoseph L Star Cheese, MD 06/01/14 1349

## 2014-06-03 ENCOUNTER — Emergency Department (HOSPITAL_COMMUNITY)
Admission: EM | Admit: 2014-06-03 | Discharge: 2014-06-03 | Disposition: A | Discharge status: Home / Self Care | Payer: Self-pay | Attending: Emergency Medicine | Admitting: Emergency Medicine

## 2014-06-03 ENCOUNTER — Encounter (HOSPITAL_COMMUNITY): Payer: Self-pay | Admitting: Emergency Medicine

## 2014-06-03 ENCOUNTER — Emergency Department (HOSPITAL_COMMUNITY): Payer: Self-pay

## 2014-06-03 DIAGNOSIS — R51 Headache: Secondary | ICD-10-CM | POA: Insufficient documentation

## 2014-06-03 DIAGNOSIS — R519 Headache, unspecified: Secondary | ICD-10-CM

## 2014-06-03 DIAGNOSIS — M542 Cervicalgia: Secondary | ICD-10-CM | POA: Insufficient documentation

## 2014-06-03 DIAGNOSIS — R079 Chest pain, unspecified: Secondary | ICD-10-CM | POA: Insufficient documentation

## 2014-06-03 DIAGNOSIS — Z7952 Long term (current) use of systemic steroids: Secondary | ICD-10-CM | POA: Insufficient documentation

## 2014-06-03 LAB — CBC WITH DIFFERENTIAL/PLATELET
Basophils Absolute: 0 10*3/uL (ref 0.0–0.1)
Basophils Relative: 0 % (ref 0–1)
Eosinophils Absolute: 0.1 10*3/uL (ref 0.0–0.7)
Eosinophils Relative: 1 % (ref 0–5)
HCT: 40.8 % (ref 36.0–46.0)
Hemoglobin: 13.9 g/dL (ref 12.0–15.0)
LYMPHS ABS: 2.5 10*3/uL (ref 0.7–4.0)
Lymphocytes Relative: 23 % (ref 12–46)
MCH: 30.5 pg (ref 26.0–34.0)
MCHC: 34.1 g/dL (ref 30.0–36.0)
MCV: 89.5 fL (ref 78.0–100.0)
Monocytes Absolute: 0.5 10*3/uL (ref 0.1–1.0)
Monocytes Relative: 5 % (ref 3–12)
NEUTROS PCT: 71 % (ref 43–77)
Neutro Abs: 7.5 10*3/uL (ref 1.7–7.7)
Platelets: 241 10*3/uL (ref 150–400)
RBC: 4.56 MIL/uL (ref 3.87–5.11)
RDW: 13.1 % (ref 11.5–15.5)
WBC: 10.6 10*3/uL — ABNORMAL HIGH (ref 4.0–10.5)

## 2014-06-03 LAB — BASIC METABOLIC PANEL
Anion gap: 9 (ref 5–15)
BUN: 8 mg/dL (ref 6–23)
CALCIUM: 9.4 mg/dL (ref 8.4–10.5)
CO2: 27 mmol/L (ref 19–32)
CREATININE: 0.74 mg/dL (ref 0.50–1.10)
Chloride: 104 mmol/L (ref 96–112)
GFR calc Af Amer: >90 mL/min (ref >90)
GFR calc non Af Amer: >90 mL/min (ref >90)
GLUCOSE: 96 mg/dL (ref 70–99)
Potassium: 3.7 mmol/L (ref 3.5–5.1)
SODIUM: 140 mmol/L (ref 135–145)

## 2014-06-03 LAB — I-STAT TROPONIN, ED: Troponin i, poc: 0.05 ng/mL (ref 0.00–0.08)

## 2014-06-03 MED ORDER — DIPHENHYDRAMINE HCL 50 MG/ML IJ SOLN
25.0000 mg | Freq: Once | INTRAMUSCULAR | Status: AC
  Administered 2014-06-03: 25 mg via INTRAVENOUS
  Filled 2014-06-03: qty 1

## 2014-06-03 MED ORDER — MAGNESIUM SULFATE 2 GM/50ML IV SOLN
2.0000 g | Freq: Once | INTRAVENOUS | Status: AC
  Administered 2014-06-03: 2 g via INTRAVENOUS
  Filled 2014-06-03: qty 50

## 2014-06-03 MED ORDER — PROCHLORPERAZINE MALEATE 10 MG PO TABS: 10.0000 mg | ORAL_TABLET | Freq: Four times a day (QID) | ORAL | Status: DC | PRN

## 2014-06-03 MED ORDER — PROCHLORPERAZINE EDISYLATE 5 MG/ML IJ SOLN
10.0000 mg | Freq: Once | INTRAMUSCULAR | Status: AC
  Administered 2014-06-03: 10 mg via INTRAVENOUS
  Filled 2014-06-03: qty 2

## 2014-06-03 MED ORDER — METHOCARBAMOL 500 MG PO TABS: 1000.0000 mg | ORAL_TABLET | Freq: Four times a day (QID) | ORAL | Status: DC | PRN

## 2014-06-03 MED ORDER — SODIUM CHLORIDE 0.9 % IV BOLUS (SEPSIS)
1000.0000 mL | Freq: Once | INTRAVENOUS | Status: AC
  Administered 2014-06-03: 1000 mL via INTRAVENOUS

## 2014-06-03 NOTE — ED Notes (Signed)
Patient stated she is ready to leave and took out own IV.  Bandage applied catheter intact. Provider notified and at bedside speaking to patient.

## 2014-06-03 NOTE — ED Provider Notes (Signed)
CSN: [id number]     Arrival date & time 06/03/14  1741 History   First MD Initiated Contact with Patient 06/03/14 1932     Chief Complaint  Patient presents with  . Headache  . Chest Pain     (Consider location/radiation/quality/duration/timing/severity/associated sxs/prior Treatment) HPI   WILLETTE MUDRY is a 34 y.o. female complaining of 10/10 lateral frontal and occipital headache. This started 4 days ago she states that it is worsening. It was initially associated with nausea and vomiting however the vomiting has resolved. The headache still persists. She reports that 12 PM she has a squeezing chest pain. States that this has improved to 4 out of 10 right now. No shortness of breath associated with it. She had a negative CT scan several days ago. She has been taking prednisone and Excedrin Migraine at home with little relief. Patient was prescribed tramadol but she self DC'd it secondary to vomiting. She normally does not get headaches. Associated symptoms of cervicalgia which is worsening over the last 24 hours. Patient denies fever, chills, rash, confusion, loss of consciousness, change in vision, photophobia, phonophobia, numbness, weakness, dysarthria, ataxia, thunderclap onset, exacerbation with exertion or Valsalva, morning exacerbation, history of DVT or PE, history of early cardiac death, exogenous estrogen or recent immobilization, calf pain or leg swelling.   History reviewed. No pertinent past medical history. Past Surgical History  Procedure Laterality Date  . Tubal ligation     Family History  Problem Relation Age of Onset  . Family history unknown: Yes   History  Substance Use Topics  . Smoking status: Never Smoker   . Smokeless tobacco: Not on file  . Alcohol Use: No   OB History    No data available     Review of Systems    Allergies  Review of patient's allergies indicates no known allergies.  Home Medications   Prior to Admission medications    Medication Sig Start Date End Date Taking? Authorizing Provider  acetaminophen (TYLENOL) 500 MG tablet Take 1,000 mg by mouth every 6 (six) hours as needed for mild pain or headache.    Historical Provider, MD  acetaminophen-codeine (TYLENOL #4) 300-60 MG per tablet Take 1 tablet by mouth every 4 (four) hours as needed for pain. Patient not taking: Reported on 05/31/2014 03/26/12   Jimmie Molly, MD  chlorhexidine (PERIDEX) 0.12 % solution Use as directed 15 mLs in the mouth or throat 4 (four) times daily. Swish thoroughly and spit. Patient not taking: Reported on 05/31/2014 12/11/13   Linna Hoff, MD  Multiple Vitamin (MULTIVITAMIN WITH MINERALS) TABS tablet Take 1 tablet by mouth daily.    Historical Provider, MD  predniSONE (DELTASONE) 10 MG tablet Take 2 tablets (20 mg total) by mouth daily. 06/01/14   Benny Lennert, MD  promethazine (PHENERGAN) 25 MG tablet Take 1 tablet (25 mg total) by mouth every 6 (six) hours as needed for nausea or vomiting. 06/01/14   Benny Lennert, MD  traMADol (ULTRAM) 50 MG tablet Take 1 tablet (50 mg total) by mouth every 6 (six) hours as needed. 06/01/14   Benny Lennert, MD  triamcinolone (KENALOG) 0.1 % paste Use on gums qid Patient not taking: Reported on 05/31/2014 12/11/13   Linna Hoff, MD   BP 132/79 mmHg  Pulse 74  Temp(Src) 98.1 F (36.7 C) (Oral)  Resp 16  SpO2 100%  LMP 05/23/2014 Physical Exam  Constitutional: She is oriented to person, place, and time. She appears well-developed  and well-nourished. No distress.  HENT:  Head: Normocephalic and atraumatic.  Mouth/Throat: Oropharynx is clear and moist.  Eyes: Conjunctivae and EOM are normal. Pupils are equal, round, and reactive to light.  Neck: Normal range of motion.  All tenderness to deep palpation of the upper cervical spine. Patient has pain in right lateral flexion, otherwise full ROM  Cardiovascular: Normal rate, regular rhythm and intact distal pulses.   Pulmonary/Chest: Effort normal  and breath sounds normal. No stridor. No respiratory distress. She has no wheezes. She has no rales. She exhibits no tenderness.  Abdominal: Soft. Bowel sounds are normal. She exhibits no distension and no mass. There is no tenderness. There is no rebound and no guarding.  Musculoskeletal: Normal range of motion. She exhibits no edema or tenderness.  No calf asymmetry, superficial collaterals, palpable cords, edema, Homans sign negative bilaterally.    Neurological: She is alert and oriented to person, place, and time. No cranial nerve deficit.  II-Visual fields grossly intact. III/IV/VI-Extraocular movements intact.  Pupils reactive bilaterally. V/VII-Smile symmetric, equal eyebrow raise,  facial sensation intact VIII- Hearing grossly intact IX/X-Normal gag XI-bilateral shoulder shrug XII-midline tongue extension Motor: 5/5 bilaterally with normal tone and bulk Cerebellar: Normal finger-to-nose  and normal heel-to-shin test.   Romberg negative Ambulates with a coordinated gait   Psychiatric: She has a normal mood and affect.  Nursing note and vitals reviewed.   ED Course  Procedures (including critical care time) Labs Review Labs Reviewed  CBC WITH DIFFERENTIAL/PLATELET - Abnormal; Notable for the following:    WBC 10.6 (*)    All other components within normal limits  BASIC METABOLIC PANEL  I-STAT TROPOININ, ED    Imaging Review Dg Chest 2 View  06/03/2014   CLINICAL DATA:  Chest pain for 1 day  EXAM: CHEST  2 VIEW  COMPARISON:  None.  FINDINGS: Normal mediastinum and cardiac silhouette. Normal pulmonary vasculature. No evidence of effusion, infiltrate, or pneumothorax. No acute bony abnormality.  IMPRESSION: No acute cardiopulmonary process.  Number chest radiograph.   Electronically Signed   By: Genevive Bi M.D.   On: 06/03/2014 18:44     EKG Interpretation None      MDM   Final diagnoses:  None    Filed Vitals:   06/03/14 2000 06/03/14 2015 06/03/14 2030  06/03/14 2057  BP: 123/76 123/81 125/84 125/84  Pulse: 60 82 87 83  Temp:    98.3 F (36.8 C)  TempSrc:    Oral  Resp: SpO2: 100% 100% 98% 100%    Medications  magnesium sulfate IVPB 2 g 50 mL (0 g Intravenous Stopped 06/03/14 2041)  diphenhydrAMINE (BENADRYL) injection 25 mg (25 mg Intravenous Given 06/03/14 2009)  prochlorperazine (COMPAZINE) injection 10 mg (10 mg Intravenous Given 06/03/14 2010)  sodium chloride 0.9 % bolus 1,000 mL (0 mLs Intravenous Stopped 06/03/14 2041)    Victorino Dike A Wiers is a pleasant 34 y.o. female presenting with severe headache, patient states this is atypical, she normally doesn't have headaches. She has mild tenderness palpation of the upper C-spine, she is afebrile with no true meningeal signs however, I think she may warrant a lumbar puncture. She's been to the ED 3 times in 3 days, she had a negative head CT 2 days ago. Her neuro exam is nonfocal here however with the combination of the severity of the headache, is atypical nature and her C-spine tenderness is possible that she has a viral meningitis causing the severe headache.  I doubt this is subarachnoid, this was not thunderclap onset. I have discussed the possibility of a lumbar puncture with attending physician who has personally evaluated the patient, also discussed it with the patient herself, we're in the process of discussing it and the patient has pulled out her IV and is asking to leave, I told patient that she is welcome to return to the ED at anytime, I've asked her to specifically return if headache worsens or changes in any way. She is alert and oriented 3, does not appear intoxicated, has capacity for medical decision making and is discharged home in good condition.  Patient is also reporting a mild chest pain which is improved spontaneously over the course the day. EKG is nonischemic, chest x-rays negative, blood work is without anemia. She has no risk factors for DVT she is low risk by  Wells criteria and PERC negative.   Evaluation does not show pathology that would require ongoing emergent intervention or inpatient treatment. Pt is hemodynamically stable and mentating appropriately. Discussed findings and plan with patient/guardian, who agrees with care plan. All questions answered. Return precautions discussed and outpatient follow up given.   Discharge Medication List as of 06/03/2014  8:50 PM    START taking these medications   Details  methocarbamol (ROBAXIN) 500 MG tablet Take 2 tablets (1,000 mg total) by mouth 4 (four) times daily as needed (Pain)., Starting 06/03/2014, Until Discontinued, Print    prochlorperazine (COMPAZINE) 10 MG tablet Take 1 tablet (10 mg total) by mouth every 6 (six) hours as needed for nausea (Headache)., Starting 06/03/2014, Until Discontinued, Print             Wynetta Emeryicole Xochitl Egle, PA-C 06/03/14 2148  Wynetta EmeryNicole Cheryel Kyte, PA-C 06/03/14 [id number]  Toy CookeyMegan Docherty, MD 06/04/14 [phone]

## 2014-06-03 NOTE — ED Notes (Signed)
Pt c/o generalized HA x 4 days; [facility] pt c/o mid sternal CP today; pt hyperventilating and tearful at present

## 2014-06-03 NOTE — Discharge Instructions (Signed)
For pain control you may take up to 800mg  of Motrin (also known as ibuprofen). That is usually 4 over the counter pills,  3 times a day. Take with food to minimize stomach irritation   For breakthrough pain you may take Robaxin/compazine. Do not drink alcohol, drive or operate heavy machinery when taking Robaxin/compazine.

## 2014-07-29 ENCOUNTER — Encounter (HOSPITAL_COMMUNITY): Payer: Self-pay | Admitting: *Deleted

## 2014-07-29 ENCOUNTER — Emergency Department (HOSPITAL_COMMUNITY)
Admission: EM | Admit: 2014-07-29 | Discharge: 2014-07-29 | Disposition: A | Discharge status: Home / Self Care | Payer: Self-pay | Source: Home / Self Care | Attending: Family Medicine | Admitting: Family Medicine

## 2014-07-29 DIAGNOSIS — H6123 Impacted cerumen, bilateral: Secondary | ICD-10-CM

## 2014-07-29 MED ORDER — NEOMYCIN-POLYMYXIN-HC 3.5-10000-1 OT SUSP
OTIC | Status: AC
  Filled 2014-07-29: qty 10

## 2014-07-29 MED ORDER — NEOMYCIN-POLYMYXIN-HC 1 % OT SOLN
4.0000 [drp] | Freq: Four times a day (QID) | OTIC | Status: DC
Start: 2014-07-29 — End: 2014-07-29

## 2014-07-29 NOTE — ED Notes (Signed)
Pt  Reports    r earache       Since yesterday        Pt  Reports  The   l  Ear     Is  painfull  As  Well

## 2014-07-29 NOTE — ED Notes (Signed)
Ears  Irrigated  By  [employer]     sneed

## 2014-07-29 NOTE — Discharge Instructions (Signed)
NO MORE Q-TIPS. Return as needed.

## 2014-07-29 NOTE — ED Provider Notes (Signed)
CSN: [id number]     Arrival date & time 07/29/14  0759 History   First MD Initiated Contact with Patient 07/29/14 [id number]     Chief Complaint  Patient presents with  . Otalgia   (Consider location/radiation/quality/duration/timing/severity/associated sxs/prior Treatment) Patient is a 34 y.o. female presenting with ear pain. The history is provided by the patient.  Otalgia Location:  Right Behind ear:  No abnormality Quality:  Pressure and sharp Severity:  Moderate Onset quality:  Sudden (after using q-tip to clean ear) Progression:  Unchanged Chronicity:  New Ineffective treatments:  OTC medications Associated symptoms: no ear discharge and no fever     History reviewed. No pertinent past medical history. Past Surgical History  Procedure Laterality Date  . Tubal ligation     Family History  Problem Relation Age of Onset  . Family history unknown: Yes   History  Substance Use Topics  . Smoking status: Never Smoker   . Smokeless tobacco: Not on file  . Alcohol Use: No   OB History    No data available     Review of Systems  Constitutional: Negative.  Negative for fever.  HENT: Positive for ear pain. Negative for ear discharge and facial swelling.     Allergies  Review of patient's allergies indicates no known allergies.  Home Medications   Prior to Admission medications   Medication Sig Start Date End Date Taking? Authorizing Provider  acetaminophen (TYLENOL) 500 MG tablet Take 1,000 mg by mouth every 6 (six) hours as needed for mild pain or headache.    Historical Provider, MD  acetaminophen-codeine (TYLENOL #4) 300-60 MG per tablet Take 1 tablet by mouth every 4 (four) hours as needed for pain. Patient not taking: Reported on 05/31/2014 03/26/12   Jimmie MollyPaolo Coll, MD  chlorhexidine (PERIDEX) 0.12 % solution Use as directed 15 mLs in the mouth or throat 4 (four) times daily. Swish thoroughly and spit. Patient not taking: Reported on 05/31/2014 12/11/13   Linna HoffJames D Kindl, MD    methocarbamol (ROBAXIN) 500 MG tablet Take 2 tablets (1,000 mg total) by mouth 4 (four) times daily as needed (Pain). 06/03/14   Joni ReiningNicole Pisciotta, PA-C  Multiple Vitamin (MULTIVITAMIN WITH MINERALS) TABS tablet Take 1 tablet by mouth daily.    Historical Provider, MD  predniSONE (DELTASONE) 10 MG tablet Take 2 tablets (20 mg total) by mouth daily. 06/01/14   Bethann BerkshireJoseph Zammit, MD  prochlorperazine (COMPAZINE) 10 MG tablet Take 1 tablet (10 mg total) by mouth every 6 (six) hours as needed for nausea (Headache). 06/03/14   Nicole Pisciotta, PA-C  promethazine (PHENERGAN) 25 MG tablet Take 1 tablet (25 mg total) by mouth every 6 (six) hours as needed for nausea or vomiting. 06/01/14   Bethann BerkshireJoseph Zammit, MD  traMADol (ULTRAM) 50 MG tablet Take 1 tablet (50 mg total) by mouth every 6 (six) hours as needed. 06/01/14   Bethann BerkshireJoseph Zammit, MD  triamcinolone (KENALOG) 0.1 % paste Use on gums qid Patient not taking: Reported on 05/31/2014 12/11/13   Linna HoffJames D Kindl, MD   BP 131/87 mmHg  Pulse 85  Temp(Src) 98.1 F (36.7 C) (Oral)  Resp 16  SpO2 97%  LMP 07/01/2014 Physical Exam  Constitutional: She appears well-developed and well-nourished.  HENT:  Head: Normocephalic.  Right Ear: There is tenderness. Decreased hearing is noted.  Left Ear: Decreased hearing is noted.  Ears:  Mouth/Throat: Oropharynx is clear and moist.  Nursing note and vitals reviewed.   ED Course  Procedures (including critical care time)  Labs Review Labs Reviewed - No data to display  Imaging Review No results found.   MDM   1. Cerumen impaction, bilateral    Sx improved and exam nl after irrig of canals bilat.    Linna Hoff, MD 07/29/14 [phone]

## 2016-06-09 ENCOUNTER — Encounter (HOSPITAL_COMMUNITY): Payer: Self-pay | Admitting: Emergency Medicine

## 2016-06-09 ENCOUNTER — Ambulatory Visit (HOSPITAL_COMMUNITY)
Admission: EM | Admit: 2016-06-09 | Discharge: 2016-06-09 | Disposition: A | Discharge status: Home / Self Care | Payer: Self-pay | Attending: Emergency Medicine | Admitting: Emergency Medicine

## 2016-06-09 DIAGNOSIS — G44221 Chronic tension-type headache, intractable: Secondary | ICD-10-CM

## 2016-06-09 DIAGNOSIS — G43109 Migraine with aura, not intractable, without status migrainosus: Secondary | ICD-10-CM

## 2016-06-09 DIAGNOSIS — H1130 Conjunctival hemorrhage, unspecified eye: Secondary | ICD-10-CM

## 2016-06-09 MED ORDER — CYCLOBENZAPRINE HCL 10 MG PO TABS: 10.0000 mg | ORAL_TABLET | Freq: Every day | ORAL | 0 refills | Status: DC

## 2016-06-09 MED ORDER — KETOROLAC TROMETHAMINE 60 MG/2ML IM SOLN
INTRAMUSCULAR | Status: AC
  Filled 2016-06-09: qty 2

## 2016-06-09 MED ORDER — NAPROXEN 500 MG PO TABS: 500.0000 mg | ORAL_TABLET | Freq: Two times a day (BID) | ORAL | 0 refills | Status: DC

## 2016-06-09 MED ORDER — KETOROLAC TROMETHAMINE 60 MG/2ML IM SOLN
60.0000 mg | Freq: Once | INTRAMUSCULAR | Status: AC
  Administered 2016-06-09: 60 mg via INTRAMUSCULAR

## 2016-06-09 NOTE — ED Triage Notes (Signed)
Pt states she was at work when she started having some right sided head pain, she then felt a stabbing pain in her right and discovered a red spot on her eye.  Pt took 250 mg of Tylenol with very minimal relief.  Pt reports some blurriness in her right eye.

## 2016-06-09 NOTE — ED Provider Notes (Signed)
CSN: [id number]     Arrival date & time 06/09/16  1605 History   First MD Initiated Contact with Patient 06/09/16 1654     Chief Complaint  Patient presents with  . Eye Problem  . Headache   (Consider location/radiation/quality/duration/timing/severity/associated sxs/prior Treatment) Patient c/o right eye redness and pulsatile headache right side behind right eye.   She has tension headaches and migraine headaches.     The history is provided by the patient.  Eye Problem  Location:  Right eye Quality:  Pulsating Severity:  Mild Onset quality:  Sudden Duration:  1 day Timing:  Constant Progression:  Waxing and waning Chronicity:  New Relieved by:  Nothing Worsened by:  Nothing Ineffective treatments:  None tried Associated symptoms: headaches and redness   Headache    History reviewed. No pertinent past medical history. Past Surgical History:  Procedure Laterality Date  . TUBAL LIGATION     Family History  Problem Relation Age of Onset  . Family history unknown: Yes   Social History  Substance Use Topics  . Smoking status: Never Smoker  . Smokeless tobacco: Never Used  . Alcohol use No   OB History    No data available     Review of Systems  Constitutional: Negative.   HENT: Negative.   Eyes: Positive for redness.  Cardiovascular: Negative.   Gastrointestinal: Negative.   Endocrine: Negative.   Genitourinary: Negative.   Musculoskeletal: Negative.   Neurological: Positive for headaches.  Hematological: Negative.   Psychiatric/Behavioral: Negative.     Allergies  Patient has no known allergies.  Home Medications   Prior to Admission medications   Medication Sig Start Date End Date Taking? Authorizing Provider  acetaminophen (TYLENOL) 500 MG tablet Take 1,000 mg by mouth every 6 (six) hours as needed for mild pain or headache.   Yes Historical Provider, MD  Multiple Vitamin (MULTIVITAMIN WITH MINERALS) TABS tablet Take 1 tablet by mouth daily.   Yes  Historical Provider, MD  acetaminophen-codeine (TYLENOL #4) 300-60 MG per tablet Take 1 tablet by mouth every 4 (four) hours as needed for pain. Patient not taking: Reported on 05/31/2014 03/26/12   Jimmie MollyPaolo Coll, MD  chlorhexidine (PERIDEX) 0.12 % solution Use as directed 15 mLs in the mouth or throat 4 (four) times daily. Swish thoroughly and spit. Patient not taking: Reported on 05/31/2014 12/11/13   Linna HoffJames D Kindl, MD  cyclobenzaprine (FLEXERIL) 10 MG tablet Take 1 tablet (10 mg total) by mouth at bedtime. 06/09/16   Deatra CanterWilliam J Shynice Sigel, FNP  methocarbamol (ROBAXIN) 500 MG tablet Take 2 tablets (1,000 mg total) by mouth 4 (four) times daily as needed (Pain). 06/03/14   Nicole Pisciotta, PA-C  naproxen (NAPROSYN) 500 MG tablet Take 1 tablet (500 mg total) by mouth 2 (two) times daily with a meal. 06/09/16   Deatra CanterWilliam J Harly Pipkins, FNP  predniSONE (DELTASONE) 10 MG tablet Take 2 tablets (20 mg total) by mouth daily. 06/01/14   Bethann BerkshireJoseph Zammit, MD  prochlorperazine (COMPAZINE) 10 MG tablet Take 1 tablet (10 mg total) by mouth every 6 (six) hours as needed for nausea (Headache). 06/03/14   Nicole Pisciotta, PA-C  promethazine (PHENERGAN) 25 MG tablet Take 1 tablet (25 mg total) by mouth every 6 (six) hours as needed for nausea or vomiting. 06/01/14   Bethann BerkshireJoseph Zammit, MD  traMADol (ULTRAM) 50 MG tablet Take 1 tablet (50 mg total) by mouth every 6 (six) hours as needed. 06/01/14   Bethann BerkshireJoseph Zammit, MD  triamcinolone (KENALOG) 0.1 % paste  Use on gums qid Patient not taking: Reported on 05/31/2014 12/11/13   Linna Hoff, MD   Meds Ordered and Administered this Visit   Medications  ketorolac (TORADOL) injection 60 mg (60 mg Intramuscular Given 06/09/16 1709)    BP 125/85 (BP Location: Right Arm)   Pulse 65   Temp 98.6 F (37 C) (Oral)   LMP 06/02/2016 (Approximate)   SpO2 100%  No data found.   Physical Exam  Constitutional: She appears well-developed and well-nourished.  HENT:  Head: Normocephalic and atraumatic.  Right  Ear: External ear normal.  Left Ear: External ear normal.  Mouth/Throat: Oropharynx is clear and moist.  Eyes: Conjunctivae and EOM are normal. Pupils are equal, round, and reactive to light.  Neck: Normal range of motion. Neck supple.  Cardiovascular: Normal rate, regular rhythm and normal heart sounds.   Pulmonary/Chest: Effort normal and breath sounds normal.  Abdominal: Soft. Bowel sounds are normal.  Nursing note and vitals reviewed.   Urgent Care Course     Procedures (including critical care time)  Labs Review Labs Reviewed - No data to display  Imaging Review No results found.   Visual Acuity Review  Right Eye Distance:   Left Eye Distance:   Bilateral Distance:    Right Eye Near:   Left Eye Near:    Bilateral Near:         MDM   1. Chronic tension-type headache, intractable   2. Migraine with aura and without status migrainosus, not intractable   3. Non-traumatic subconjunctival hemorrhage, unspecified laterality    Naprosyn 500mg  one po bid x 10 days #20 Flexeril 10mg  one po qhs prn #10  Explained if we tx underlying cervicalgia and tension headaches she will not get migraines.    Reassured her right subconjunctival hemorrhage will heal up with time and her headaches are not related and the right sided ocular headache has nothing to do with the subconjunctival hemorrhage.  Toradol 60mg  IM      Deatra Canter, FNP 06/09/16 1735

## 2018-08-03 ENCOUNTER — Other Ambulatory Visit: Payer: Self-pay

## 2018-08-03 ENCOUNTER — Ambulatory Visit (HOSPITAL_COMMUNITY)
Admission: EM | Admit: 2018-08-03 | Discharge: 2018-08-03 | Disposition: A | Discharge status: Home / Self Care | Payer: Self-pay | Attending: Family Medicine | Admitting: Family Medicine

## 2018-08-03 ENCOUNTER — Encounter (HOSPITAL_COMMUNITY): Payer: Self-pay

## 2018-08-03 DIAGNOSIS — H6123 Impacted cerumen, bilateral: Secondary | ICD-10-CM

## 2018-08-03 MED ORDER — AMOXICILLIN 500 MG PO CAPS: 1000.0000 mg | ORAL_CAPSULE | Freq: Three times a day (TID) | ORAL | 0 refills | Status: AC

## 2018-08-03 NOTE — ED Triage Notes (Signed)
Pt present both ears feels clogged. Symptoms started on Sunday after she washed her hair. Pt states she barely can hear out her ears.

## 2018-08-03 NOTE — Discharge Instructions (Addendum)
We were able to clean your ears out.  I am treating you for ear infection amoxicillin 3 times a day for the next 5 days  I hope you feel better

## 2018-08-05 NOTE — ED Provider Notes (Signed)
[facility]    CSN: [id number] Arrival date & time: 08/03/18  1557     History   Chief Complaint Chief Complaint  Patient presents with  . Ear Fullness    HPI Shannon Marks is a 38 y.o. female.   Pt is a 38 year old female that presents with bilateral ear fullness, trouble hearing, ear pain and cerumen. This has been present and worsening over the past 6 days. Symptoms are constant and she hasn't done anything to treat the symptoms. No fever, chill, nasal congestion or rhinorrhea.   ROS per HPI      History reviewed. No pertinent past medical history.  There are no active problems to display for this patient.   Past Surgical History:  Procedure Laterality Date  . TUBAL LIGATION      OB History   No obstetric history on file.      Home Medications    Prior to Admission medications   Medication Sig Start Date End Date Taking? Authorizing Provider  acetaminophen (TYLENOL) 500 MG tablet Take 1,000 mg by mouth every 6 (six) hours as needed for mild pain or headache.    [provider]  acetaminophen-codeine (TYLENOL #4) 300-60 MG per tablet Take 1 tablet by mouth every 4 (four) hours as needed for pain. Patient not taking: Reported on 05/31/2014 03/26/12   Jimmie Molly, MD  amoxicillin (AMOXIL) 500 MG capsule Take 2 capsules (1,000 mg total) by mouth 3 (three) times daily for 5 days. 08/03/18 08/08/18  Dahlia Byes A, NP  chlorhexidine (PERIDEX) 0.12 % solution Use as directed 15 mLs in the mouth or throat 4 (four) times daily. Swish thoroughly and spit. Patient not taking: Reported on 05/31/2014 12/11/13   Linna Hoff, MD  cyclobenzaprine (FLEXERIL) 10 MG tablet Take 1 tablet (10 mg total) by mouth at bedtime. 06/09/16   Deatra Canter, FNP  methocarbamol (ROBAXIN) 500 MG tablet Take 2 tablets (1,000 mg total) by mouth 4 (four) times daily as needed (Pain). 06/03/14   Pisciotta, Joni Reining, PA-C  Multiple Vitamin (MULTIVITAMIN WITH MINERALS) TABS tablet  Take 1 tablet by mouth daily.    [provider]  naproxen (NAPROSYN) 500 MG tablet Take 1 tablet (500 mg total) by mouth 2 (two) times daily with a meal. 06/09/16   Oxford, Anselm Pancoast, FNP  predniSONE (DELTASONE) 10 MG tablet Take 2 tablets (20 mg total) by mouth daily. 06/01/14   Bethann Berkshire, MD  prochlorperazine (COMPAZINE) 10 MG tablet Take 1 tablet (10 mg total) by mouth every 6 (six) hours as needed for nausea (Headache). 06/03/14   Pisciotta, Joni Reining, PA-C  promethazine (PHENERGAN) 25 MG tablet Take 1 tablet (25 mg total) by mouth every 6 (six) hours as needed for nausea or vomiting. 06/01/14   Bethann Berkshire, MD  traMADol (ULTRAM) 50 MG tablet Take 1 tablet (50 mg total) by mouth every 6 (six) hours as needed. 06/01/14   Bethann Berkshire, MD  triamcinolone (KENALOG) 0.1 % paste Use on gums qid Patient not taking: Reported on 05/31/2014 12/11/13   Linna Hoff, MD    Family History Family History  Family history unknown: Yes    Social History Social History   Tobacco Use  . Smoking status: Never Smoker  . Smokeless tobacco: Never Used  Substance Use Topics  . Alcohol use: No  . Drug use: No     Allergies   Patient has no known allergies.   Review of Systems Review of Systems  Physical Exam Triage Vital Signs ED Triage Vitals [08/03/18 1615]  Enc Vitals Group     BP 132/80     Pulse Rate 63     Resp      Temp 98.6 F (37 C)     Temp Source Oral     SpO2 100 %     Weight      Height      Head Circumference      Peak Flow      Pain Score 8     Pain Loc      Pain Edu?      Excl. in GC?    No data found.  Updated Vital Signs BP 132/80 (BP Location: Right Arm)   Pulse 63   Temp 98.6 F (37 C) (Oral)   LMP 07/23/2018   SpO2 100%   Visual Acuity Right Eye Distance:   Left Eye Distance:   Bilateral Distance:    Right Eye Near:   Left Eye Near:    Bilateral Near:     Physical Exam Vitals signs and nursing note reviewed.  Constitutional:       General: She is not in acute distress.    Appearance: Normal appearance. She is not ill-appearing, toxic-appearing or diaphoretic.  HENT:     Head: Normocephalic.     Right Ear: There is impacted cerumen.     Left Ear: There is impacted cerumen.     Nose: Nose normal.     Mouth/Throat:     Pharynx: Oropharynx is clear.  Eyes:     Conjunctiva/sclera: Conjunctivae normal.  Neck:     Musculoskeletal: Normal range of motion.  Pulmonary:     Effort: Pulmonary effort is normal.  Abdominal:     Palpations: Abdomen is soft.     Tenderness: There is no abdominal tenderness.  Musculoskeletal: Normal range of motion.  Skin:    General: Skin is warm and dry.     Findings: No rash.  Neurological:     Mental Status: She is alert.  Psychiatric:        Mood and Affect: Mood normal.      UC Treatments / Results  Labs (all labs ordered are listed, but only abnormal results are displayed) Labs Reviewed - No data to display  EKG None  Radiology No results found.  Procedures Procedures (including critical care time)  Medications Ordered in UC Medications - No data to display  Initial Impression / Assessment and Plan / UC Course  I have reviewed the triage vital signs and the nursing notes.  Pertinent labs & imaging results that were available during my care of the patient were reviewed by me and considered in my medical decision making (see chart for details).     Bilateral cerumen impaction- severe Ears flushed in the clinic with lavage. Upon reassessment of the ears both appeared to be infected and she is in a lot of pain.  Will go ahead and treat for ear infection She can take tylenol or ibuprofen for the pain Follow up as needed for continued or worsening symptoms Final Clinical Impressions(s) / UC Diagnoses   Final diagnoses:  Bilateral impacted cerumen     Discharge Instructions     We were able to clean your ears out.  I am treating you for ear infection  amoxicillin 3 times a day for the next 5 days  I hope you feel better     ED Prescriptions    Medication  Sig Dispense Auth. Provider   amoxicillin (AMOXIL) 500 MG capsule Take 2 capsules (1,000 mg total) by mouth 3 (three) times daily for 5 days. 30 capsule Dahlia Byes A, NP     Controlled Substance Prescriptions [place] Controlled Substance Registry consulted? no   Janace Aris, NP 08/05/18 1531

## 2018-12-14 ENCOUNTER — Encounter (HOSPITAL_BASED_OUTPATIENT_CLINIC_OR_DEPARTMENT_OTHER): Payer: Self-pay

## 2018-12-14 ENCOUNTER — Other Ambulatory Visit: Payer: Self-pay

## 2018-12-14 ENCOUNTER — Emergency Department (HOSPITAL_BASED_OUTPATIENT_CLINIC_OR_DEPARTMENT_OTHER): Payer: Self-pay

## 2018-12-14 ENCOUNTER — Emergency Department (HOSPITAL_BASED_OUTPATIENT_CLINIC_OR_DEPARTMENT_OTHER)
Admission: EM | Admit: 2018-12-14 | Discharge: 2018-12-14 | Disposition: A | Discharge status: Home / Self Care | Payer: Self-pay | Attending: Emergency Medicine | Admitting: Emergency Medicine

## 2018-12-14 DIAGNOSIS — R52 Pain, unspecified: Secondary | ICD-10-CM

## 2018-12-14 DIAGNOSIS — R1013 Epigastric pain: Secondary | ICD-10-CM | POA: Insufficient documentation

## 2018-12-14 DIAGNOSIS — Z3202 Encounter for pregnancy test, result negative: Secondary | ICD-10-CM | POA: Insufficient documentation

## 2018-12-14 DIAGNOSIS — Z79899 Other long term (current) drug therapy: Secondary | ICD-10-CM | POA: Insufficient documentation

## 2018-12-14 LAB — COMPREHENSIVE METABOLIC PANEL
ALT: 16 U/L (ref 0–44)
AST: 19 U/L (ref 15–41)
Albumin: 4.6 g/dL (ref 3.5–5.0)
Alkaline Phosphatase: 39 U/L (ref 38–126)
Anion gap: 11 (ref 5–15)
BUN: 10 mg/dL (ref 6–20)
CO2: 21 mmol/L — ABNORMAL LOW (ref 22–32)
Calcium: 9.5 mg/dL (ref 8.9–10.3)
Chloride: 107 mmol/L (ref 98–111)
Creatinine, Ser: 0.59 mg/dL (ref 0.44–1.00)
GFR calc Af Amer: >60 mL/min (ref >60)
GFR calc non Af Amer: >60 mL/min (ref >60)
Glucose, Bld: 111 mg/dL — ABNORMAL HIGH (ref 70–99)
Potassium: 3.8 mmol/L (ref 3.5–5.1)
Sodium: 139 mmol/L (ref 135–145)
Total Bilirubin: 0.4 mg/dL (ref 0.3–1.2)
Total Protein: 7.4 g/dL (ref 6.5–8.1)

## 2018-12-14 LAB — URINALYSIS, ROUTINE W REFLEX MICROSCOPIC
Bilirubin Urine: NEGATIVE
Glucose, UA: NEGATIVE mg/dL
Hgb urine dipstick: NEGATIVE
Ketones, ur: 15 mg/dL — AB
Leukocytes,Ua: NEGATIVE
Nitrite: NEGATIVE
Protein, ur: NEGATIVE mg/dL
Specific Gravity, Urine: 1.01 (ref 1.005–1.030)
pH: 6 (ref 5.0–8.0)

## 2018-12-14 LAB — CBC WITH DIFFERENTIAL/PLATELET
Abs Immature Granulocytes: 0.02 10*3/uL (ref 0.00–0.07)
Basophils Absolute: 0 10*3/uL (ref 0.0–0.1)
Basophils Relative: 0 %
Eosinophils Absolute: 0.1 10*3/uL (ref 0.0–0.5)
Eosinophils Relative: 1 %
HCT: 41.5 % (ref 36.0–46.0)
Hemoglobin: 13.8 g/dL (ref 12.0–15.0)
Immature Granulocytes: 0 %
Lymphocytes Relative: 21 %
Lymphs Abs: 1.9 10*3/uL (ref 0.7–4.0)
MCH: 30 pg (ref 26.0–34.0)
MCHC: 33.3 g/dL (ref 30.0–36.0)
MCV: 90.2 fL (ref 80.0–100.0)
Monocytes Absolute: 0.4 10*3/uL (ref 0.1–1.0)
Monocytes Relative: 4 %
Neutro Abs: 6.8 10*3/uL (ref 1.7–7.7)
Neutrophils Relative %: 74 %
Platelets: 226 10*3/uL (ref 150–400)
RBC: 4.6 MIL/uL (ref 3.87–5.11)
RDW: 12.9 % (ref 11.5–15.5)
WBC: 9.2 10*3/uL (ref 4.0–10.5)
nRBC: 0 % (ref 0.0–0.2)

## 2018-12-14 LAB — PREGNANCY, URINE: Preg Test, Ur: NEGATIVE

## 2018-12-14 LAB — LIPASE, BLOOD: Lipase: 30 U/L (ref 11–51)

## 2018-12-14 MED ORDER — ALUM & MAG HYDROXIDE-SIMETH 200-200-20 MG/5ML PO SUSP
30.0000 mL | Freq: Once | ORAL | Status: AC
  Administered 2018-12-14: 30 mL via ORAL
  Filled 2018-12-14: qty 30

## 2018-12-14 MED ORDER — SUCRALFATE 1 G PO TABS: 1.0000 g | ORAL_TABLET | Freq: Three times a day (TID) | ORAL | 0 refills | Status: DC

## 2018-12-14 MED ORDER — LIDOCAINE VISCOUS HCL 2 % MT SOLN
15.0000 mL | Freq: Once | OROMUCOSAL | Status: AC
  Administered 2018-12-14: 15 mL via ORAL
  Filled 2018-12-14: qty 15

## 2018-12-14 MED ORDER — KETOROLAC TROMETHAMINE 30 MG/ML IJ SOLN
30.0000 mg | Freq: Once | INTRAMUSCULAR | Status: AC
  Administered 2018-12-14: 30 mg via INTRAVENOUS
  Filled 2018-12-14: qty 1

## 2018-12-14 NOTE — Discharge Instructions (Signed)
It was my pleasure taking care of you today!   Carafate as directed.  Tylenol as needed for pain.   Follow up with primary care provider --  see attached resource guide.   Return to ER for fever, vomiting, new or worsening symptoms, any additional concerns.

## 2018-12-14 NOTE — ED Provider Notes (Signed)
[facility] EMERGENCY DEPARTMENT Provider Note   CSN: [id number] Arrival date & time: 12/14/18  1507     History   Chief Complaint Chief Complaint  Patient presents with  . Abdominal Pain    HPI Shannon MinisterJennifer A Tieu is a 38 y.o. female.     The history is provided by the patient and medical records. No language interpreter was used.  Abdominal Pain Associated symptoms: no constipation, no diarrhea, no nausea and no vomiting    Shannon MinisterJennifer A Fei is a 38 y.o. female with no pertinent PMH  who presents to the Emergency Department complaining of epigastric pain which began yesterday appears like a squeezing, sharp pain.  Denies associated nausea or vomiting.  No urinary symptoms.  No cough, congestion, fever.  No chest pain or shortness of breath.  No medications taken prior to arrival for symptoms.  Prior tubal ligation, but otherwise no abdominal surgeries.  Denies history of similar.  History reviewed. No pertinent past medical history.  There are no active problems to display for this patient.   Past Surgical History:  Procedure Laterality Date  . TUBAL LIGATION       OB History   No obstetric history on file.      Home Medications    Prior to Admission medications   Medication Sig Start Date End Date Taking? Authorizing Provider  acetaminophen (TYLENOL) 500 MG tablet Take 1,000 mg by mouth every 6 (six) hours as needed for mild pain or headache.    [provider]  acetaminophen-codeine (TYLENOL #4) 300-60 MG per tablet Take 1 tablet by mouth every 4 (four) hours as needed for pain. Patient not taking: Reported on 05/31/2014 03/26/12   Jimmie Mollyoll, Paolo, MD  chlorhexidine (PERIDEX) 0.12 % solution Use as directed 15 mLs in the mouth or throat 4 (four) times daily. Swish thoroughly and spit. Patient not taking: Reported on 05/31/2014 12/11/13   Linna HoffKindl, James D, MD  cyclobenzaprine (FLEXERIL) 10 MG tablet Take 1 tablet (10 mg total) by mouth at bedtime.  06/09/16   Deatra Canterxford, William J, FNP  methocarbamol (ROBAXIN) 500 MG tablet Take 2 tablets (1,000 mg total) by mouth 4 (four) times daily as needed (Pain). 06/03/14   Pisciotta, Joni ReiningNicole, PA-C  Multiple Vitamin (MULTIVITAMIN WITH MINERALS) TABS tablet Take 1 tablet by mouth daily.    [provider]  naproxen (NAPROSYN) 500 MG tablet Take 1 tablet (500 mg total) by mouth 2 (two) times daily with a meal. 06/09/16   Oxford, Anselm PancoastWilliam J, FNP  predniSONE (DELTASONE) 10 MG tablet Take 2 tablets (20 mg total) by mouth daily. 06/01/14   Bethann BerkshireZammit, Joseph, MD  prochlorperazine (COMPAZINE) 10 MG tablet Take 1 tablet (10 mg total) by mouth every 6 (six) hours as needed for nausea (Headache). 06/03/14   Pisciotta, Joni ReiningNicole, PA-C  promethazine (PHENERGAN) 25 MG tablet Take 1 tablet (25 mg total) by mouth every 6 (six) hours as needed for nausea or vomiting. 06/01/14   Bethann BerkshireZammit, Joseph, MD  sucralfate (CARAFATE) 1 g tablet Take 1 tablet (1 g total) by mouth 4 (four) times daily -  with meals and at bedtime. 12/14/18   Kayl Stogdill, Chase PicketJaime Pilcher, PA-C  traMADol (ULTRAM) 50 MG tablet Take 1 tablet (50 mg total) by mouth every 6 (six) hours as needed. 06/01/14   Bethann BerkshireZammit, Joseph, MD  triamcinolone (KENALOG) 0.1 % paste Use on gums qid Patient not taking: Reported on 05/31/2014 12/11/13   Linna HoffKindl, James D, MD    Family History Family History  Family history unknown: Yes    Social History Social History   Tobacco Use  . Smoking status: Never Smoker  . Smokeless tobacco: Never Used  Substance Use Topics  . Alcohol use: No  . Drug use: No     Allergies   Patient has no known allergies.   Review of Systems Review of Systems  Gastrointestinal: Positive for abdominal pain. Negative for blood in stool, constipation, diarrhea, nausea and vomiting.  All other systems reviewed and are negative.    Physical Exam Updated Vital Signs BP (!) 138/101 (BP Location: Left Arm)   Pulse 96   Temp 99.2 F (37.3 C) (Oral)   Resp 20   Ht  5\' 2"  (1.575 m)   Wt 56.7 kg   LMP 11/30/2018   SpO2 100%   BMI 22.86 kg/m   Physical Exam Vitals signs and nursing note reviewed.  Constitutional:      General: She is not in acute distress.    Appearance: She is well-developed.  HENT:     Head: Normocephalic and atraumatic.  Neck:     Musculoskeletal: Neck supple.  Cardiovascular:     Rate and Rhythm: Normal rate and regular rhythm.     Heart sounds: Normal heart sounds. No murmur.  Pulmonary:     Effort: Pulmonary effort is normal. No respiratory distress.     Breath sounds: Normal breath sounds.  Chest:    Abdominal:     General: There is no distension.     Palpations: Abdomen is soft.     Comments: Epigastric tenderness.  Negative Murphy's.  Skin:    General: Skin is warm and dry.  Neurological:     Mental Status: She is alert and oriented to person, place, and time.      ED Treatments / Results  Labs (all labs ordered are listed, but only abnormal results are displayed) Labs Reviewed  URINALYSIS, ROUTINE W REFLEX MICROSCOPIC - Abnormal; Notable for the following components:      Result Value   Color, Urine STRAW (*)    Ketones, ur 15 (*)    All other components within normal limits  COMPREHENSIVE METABOLIC PANEL - Abnormal; Notable for the following components:   CO2 21 (*)    Glucose, Bld 111 (*)    All other components within normal limits  PREGNANCY, URINE  CBC WITH DIFFERENTIAL/PLATELET  LIPASE, BLOOD    EKG None  Radiology No results found.  Procedures Procedures (including critical care time)  Medications Ordered in ED Medications  ketorolac (TORADOL) 30 MG/ML injection 30 mg (30 mg Intravenous Given 12/14/18 1709)  alum & mag hydroxide-simeth (MAALOX/MYLANTA) 200-200-20 MG/5ML suspension 30 mL (30 mLs Oral Given 12/14/18 1709)    And  lidocaine (XYLOCAINE) 2 % viscous mouth solution 15 mL (15 mLs Oral Given 12/14/18 1709)     Initial Impression / Assessment and Plan / ED Course  I  have reviewed the triage vital signs and the nursing notes.  Pertinent labs & imaging results that were available during my care of the patient were reviewed by me and considered in my medical decision making (see chart for details).       Shannon Marks is a [age] y.o. female who presents to ED for epigastric abdominal pain since yesterday. On exam, patient is afebrile, hemodynamically stable with epigastric tenderness.  Negative Murphy's.  Patient does seem to have point tenderness to the rib cage at arch on the left.  No skin changes.  Chest  x-ray without any bony lesions and RUQ ultrasound without acute findings per Dr. Darl Householder who independently reviewed film. Labs and urine reassuring. Preg negative. Evaluation does not show pathology that would require ongoing emergent intervention or inpatient treatment.  Feels better on reevaluation after symptomatic care in the emergency department.  Non-surgical abdominal exam on reevaluation.  Will treat symptomatically and have her follow-up with PCP.  Reasons to return to the emergency department were discussed and all questions were answered.  Patient discussed with Dr. Darl Householder who agrees with treatment plan.    Final Clinical Impressions(s) / ED Diagnoses   Final diagnoses:  Pain  Epigastric pain    ED Discharge Orders         Ordered    sucralfate (CARAFATE) 1 g tablet  3 times daily with meals & bedtime     12/14/18 1914           Christabella Alvira, Ozella Almond, PA-C 12/14/18 1921    Drenda Freeze, MD 12/15/18 1451

## 2018-12-14 NOTE — ED Triage Notes (Addendum)
C/o LUQ pain/epigastric pain started yesterday-NAD-steady gait

## 2019-04-19 ENCOUNTER — Other Ambulatory Visit: Payer: Self-pay

## 2020-02-11 ENCOUNTER — Encounter (HOSPITAL_COMMUNITY): Payer: Self-pay | Admitting: Emergency Medicine

## 2020-02-11 ENCOUNTER — Emergency Department (HOSPITAL_COMMUNITY): Payer: Managed Care, Other (non HMO)

## 2020-02-11 ENCOUNTER — Emergency Department (HOSPITAL_COMMUNITY)
Admission: EM | Admit: 2020-02-11 | Discharge: 2020-02-11 | Disposition: A | Discharge status: Home / Self Care | Payer: Managed Care, Other (non HMO) | Attending: Emergency Medicine | Admitting: Emergency Medicine

## 2020-02-11 ENCOUNTER — Other Ambulatory Visit: Payer: Self-pay

## 2020-02-11 DIAGNOSIS — R509 Fever, unspecified: Secondary | ICD-10-CM | POA: Insufficient documentation

## 2020-02-11 DIAGNOSIS — R1010 Upper abdominal pain, unspecified: Secondary | ICD-10-CM | POA: Diagnosis not present

## 2020-02-11 DIAGNOSIS — Z20822 Contact with and (suspected) exposure to covid-19: Secondary | ICD-10-CM | POA: Diagnosis not present

## 2020-02-11 DIAGNOSIS — K920 Hematemesis: Secondary | ICD-10-CM | POA: Insufficient documentation

## 2020-02-11 DIAGNOSIS — R1030 Lower abdominal pain, unspecified: Secondary | ICD-10-CM | POA: Insufficient documentation

## 2020-02-11 DIAGNOSIS — R197 Diarrhea, unspecified: Secondary | ICD-10-CM | POA: Diagnosis not present

## 2020-02-11 DIAGNOSIS — R5383 Other fatigue: Secondary | ICD-10-CM | POA: Diagnosis not present

## 2020-02-11 DIAGNOSIS — R112 Nausea with vomiting, unspecified: Secondary | ICD-10-CM

## 2020-02-11 LAB — COMPREHENSIVE METABOLIC PANEL
ALT: 197 U/L — ABNORMAL HIGH (ref 0–44)
AST: 151 U/L — ABNORMAL HIGH (ref 15–41)
Albumin: 3.4 g/dL — ABNORMAL LOW (ref 3.5–5.0)
Alkaline Phosphatase: 112 U/L (ref 38–126)
Anion gap: 15 (ref 5–15)
BUN: 7 mg/dL (ref 6–20)
CO2: 24 mmol/L (ref 22–32)
Calcium: 8.9 mg/dL (ref 8.9–10.3)
Chloride: 99 mmol/L (ref 98–111)
Creatinine, Ser: 0.66 mg/dL (ref 0.44–1.00)
GFR, Estimated: >60 mL/min (ref >60)
Glucose, Bld: 110 mg/dL — ABNORMAL HIGH (ref 70–99)
Potassium: 3.2 mmol/L — ABNORMAL LOW (ref 3.5–5.1)
Sodium: 138 mmol/L (ref 135–145)
Total Bilirubin: 0.8 mg/dL (ref 0.3–1.2)
Total Protein: 6.7 g/dL (ref 6.5–8.1)

## 2020-02-11 LAB — LIPASE, BLOOD: Lipase: 20 U/L (ref 11–51)

## 2020-02-11 LAB — URINALYSIS, ROUTINE W REFLEX MICROSCOPIC
Bilirubin Urine: NEGATIVE
Glucose, UA: NEGATIVE mg/dL
Ketones, ur: NEGATIVE mg/dL
Nitrite: POSITIVE — AB
Protein, ur: NEGATIVE mg/dL
Specific Gravity, Urine: 1.006 (ref 1.005–1.030)
pH: 7 (ref 5.0–8.0)

## 2020-02-11 LAB — CBC
HCT: 35.9 % — ABNORMAL LOW (ref 36.0–46.0)
Hemoglobin: 12.1 g/dL (ref 12.0–15.0)
MCH: 31.2 pg (ref 26.0–34.0)
MCHC: 33.7 g/dL (ref 30.0–36.0)
MCV: 92.5 fL (ref 80.0–100.0)
Platelets: 237 10*3/uL (ref 150–400)
RBC: 3.88 MIL/uL (ref 3.87–5.11)
RDW: 13.2 % (ref 11.5–15.5)
WBC: 12.3 10*3/uL — ABNORMAL HIGH (ref 4.0–10.5)
nRBC: 0 % (ref 0.0–0.2)

## 2020-02-11 LAB — RESPIRATORY PANEL BY RT PCR (FLU A&B, COVID)
Influenza A by PCR: NEGATIVE
Influenza B by PCR: NEGATIVE
SARS Coronavirus 2 by RT PCR: NEGATIVE

## 2020-02-11 LAB — HCG, QUANTITATIVE, PREGNANCY: hCG, Beta Chain, Quant, S: <1 m[IU]/mL (ref <5)

## 2020-02-11 MED ORDER — SODIUM CHLORIDE 0.9 % IV SOLN
1.0000 g | Freq: Once | INTRAVENOUS | Status: AC
  Administered 2020-02-11: 1 g via INTRAVENOUS
  Filled 2020-02-11: qty 10

## 2020-02-11 MED ORDER — ACETAMINOPHEN 500 MG PO TABS
1000.0000 mg | ORAL_TABLET | Freq: Once | ORAL | Status: AC
  Administered 2020-02-11: 1000 mg via ORAL
  Filled 2020-02-11: qty 2

## 2020-02-11 MED ORDER — SODIUM CHLORIDE 0.9 % IV BOLUS
500.0000 mL | Freq: Once | INTRAVENOUS | Status: AC
  Administered 2020-02-11: 500 mL via INTRAVENOUS

## 2020-02-11 MED ORDER — ONDANSETRON HCL 4 MG PO TABS: 4.0000 mg | ORAL_TABLET | Freq: Four times a day (QID) | ORAL | 0 refills | Status: DC

## 2020-02-11 MED ORDER — CEPHALEXIN 500 MG PO CAPS: 500.0000 mg | ORAL_CAPSULE | Freq: Four times a day (QID) | ORAL | 0 refills | Status: AC

## 2020-02-11 MED ORDER — ONDANSETRON HCL 4 MG/2ML IJ SOLN
4.0000 mg | Freq: Once | INTRAMUSCULAR | Status: DC | PRN
  Filled 2020-02-11: qty 2

## 2020-02-11 MED ORDER — ONDANSETRON HCL 4 MG/2ML IJ SOLN
4.0000 mg | Freq: Once | INTRAMUSCULAR | Status: AC
  Administered 2020-02-11: 4 mg via INTRAVENOUS
  Filled 2020-02-11: qty 2

## 2020-02-11 MED ORDER — IOHEXOL 300 MG/ML  SOLN
100.0000 mL | Freq: Once | INTRAMUSCULAR | Status: AC | PRN
  Administered 2020-02-11: 100 mL via INTRAVENOUS

## 2020-02-11 MED ORDER — MORPHINE SULFATE (PF) 4 MG/ML IV SOLN
4.0000 mg | Freq: Once | INTRAVENOUS | Status: AC
  Administered 2020-02-11: 4 mg via INTRAVENOUS
  Filled 2020-02-11: qty 1

## 2020-02-11 MED ORDER — SODIUM CHLORIDE 0.9 % IV BOLUS
1000.0000 mL | Freq: Once | INTRAVENOUS | Status: AC
  Administered 2020-02-11: 1000 mL via INTRAVENOUS

## 2020-02-11 MED ORDER — SODIUM CHLORIDE (PF) 0.9 % IJ SOLN
INTRAMUSCULAR | Status: AC
  Filled 2020-02-11: qty 50

## 2020-02-11 NOTE — ED Provider Notes (Signed)
Patient received at handoff from Gwendalyn Ege PA-C she provided HPI, current work-up, likely disposition please see her note for full detail.  In short patient has no significant medical history, she presents with fevers, chills, abdominal pain, nausea and vomiting that started on Friday.  Patient states she has had increasing abdominal pain and has had multiple episodes of vomiting.  She states she noticed some blood streaks in her vomit, denies hematuria, dysuria, urinary frequency urgency, denies diarrhea constipation or blood in her stools.  Has no abdominal history, denies history of kidney stones, UTIs, pyelonephritis, she does endorse that she had a tubal ligation after her previous pregnancy.  Previous provider worked her up and labs show CBC showing leukocytosis of 12.3, no signs of anemia, CMP showing hypoalbumin 3.2, no metabolic acidosis, hyperglycemia of 110, no AKI, elevated liver enzymes AST 151, ALT 197, no anion gap.  UA shows positive nitrates, small leukocytes, many white blood cells, few bacteria.  Lipase 20, hCG less than 1, respiratory panel negative for Covid, influenza A/B.  Previous provider sent patient down for CT abdomen for further evaluation. Physical Exam  BP 118/80   Pulse 86   Temp (!) 102.1 F (38.9 C) (Oral)   Resp 17   Ht 5\' 2"  (1.575 m)   Wt 55.3 kg   LMP 01/22/2020 (Approximate) Comment: negative HCG quantitative 02/11/20  SpO2 100%   BMI 22.31 kg/m   Physical Exam Vitals and nursing note reviewed.  Constitutional:      General: She is not in acute distress.    Appearance: She is not ill-appearing.  HENT:     Head: Normocephalic and atraumatic.     Nose: No congestion.     Mouth/Throat:     Mouth: Mucous membranes are moist.     Pharynx: Oropharynx is clear.  Eyes:     General: No scleral icterus. Cardiovascular:     Rate and Rhythm: Normal rate and regular rhythm.     Pulses: Normal pulses.     Heart sounds: No murmur heard.  No friction rub.  No gallop.   Pulmonary:     Effort: No respiratory distress.     Breath sounds: No wheezing, rhonchi or rales.  Abdominal:     General: There is no distension.     Tenderness: There is no abdominal tenderness. There is no guarding.     Comments: Abdomen was nondistended, normoactive bowel sounds, dull to percussion, she had pain in the periumbilical region as well as lower right and left quadrants.  No peritoneal sign or acute abdomen noted on exam.  Musculoskeletal:        General: No swelling.     Right lower leg: No edema.     Left lower leg: No edema.  Skin:    General: Skin is warm and dry.     Findings: No rash.  Neurological:     Mental Status: She is alert.  Psychiatric:        Mood and Affect: Mood normal.     ED Course/Procedures     Procedures  MDM   CT abdomen pelvis shows acute cystitis with bilateral pyelonephritis no calculi.  will culture patient's urine start patient on Rocephin and provide another liter of fluids.  And reevaluate.  If she is tolerating p.o. will DC patient and treat her outpatient for pyelonephritis.  Patient was reassessed states she is feeling much better, tolerating p.o. without difficulty denies abdominal pain at this time.   I have  low suspicion patient suffering from acute abdomen requiring surgical intervention as she is tolerating p.o., CT abdomen pelvis does not show any acute findings other than bilateral pyelonephritis with acute cystitis.  Low suspicion for gallbladder or liver abnormality as she has no right upper quadrant pain there is noted an elevated liver enzymes but CT scan does not show any acute abnormalities.  Possible elevation could be from nausea and vomiting. will have her follow-up with PCP for further evaluation.  Low suspicion for ovarian torsion or ectopic pregnancy as patient denies pelvic pain, vaginal discharge, vaginal bleeding, hCG less than 1.  I have low suspicion patient needs to be hospitalized for  pyelonephritis as she is tolerating p.o., she does not appear to be toxic at this time, vital signs reassuring.  Will prescribe patient antibiotics and Zofran and have her follow-up PCP for further evaluation.  Patient vital signs remained stable, no indication for hospital admission.  Patient was discussed with attending who agrees assessment and plan.  Patient given at home care as well strict return precautions.  Patient verbalized he understood and agreed to said plan.        Carroll Sage, PA-C 02/11/20 1055    Donnetta Hutching, MD 02/12/20 [phone]

## 2020-02-11 NOTE — ED Notes (Signed)
Requested urine from patient. Patient complaining of lower abdominal pain. 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

## 2020-02-11 NOTE — ED Triage Notes (Signed)
Pt arriving POV with complaint of upper abdominal pain, N/V, and hematemesis. Pt reports having fever since Friday but has only taken Theraflu for symptoms prior to arrival to ER.

## 2020-02-11 NOTE — ED Provider Notes (Signed)
[facility] Note   CSN: [id number] Arrival date & time: 02/11/20  0501     History Chief Complaint  Patient presents with  . Hematemesis  . Abdominal Pain    Shannon Marks is a 39 y.o. female who presents for evaluation of 4 days abdominal pain, nausea, low-grade fever, fatigue.  She reports that symptoms initially began about 4 days ago.  She thought she has the flu so she is taking over-the-counter TheraFlu with minimal improvement.  She states that over the last day or so, she started having vomiting.  Today while she was at work, she had an episode of vomiting that was emesis with streaks of blood.  No gross hematemesis.  She states her abdominal pain is continue to persist, prompting the visit.  She has had some occasional bouts of diarrhea.  No blood noted in the stools.  She states she is not any dysuria or hematuria.  She states she just finished her menstrual cycle but did not notice any other irregular vaginal bleeding.  States she has had a history of a tubal ligation.  No other abdominal surgeries.  She states she has not been vaccinated for Covid.  No known Covid exposure.  Denies any cough, congestion, chest pain, difficulty breathing.  The history is provided by the patient.       History reviewed. No pertinent past medical history.  There are no problems to display for this patient.   Past Surgical History:  Procedure Laterality Date  . TUBAL LIGATION       OB History   No obstetric history on file.     Family History  Family history unknown: Yes    Social History   Tobacco Use  . Smoking status: Never Smoker  . Smokeless tobacco: Never Used  Vaping Use  . Vaping Use: Never used  Substance Use Topics  . Alcohol use: No  . Drug use: No    Home Medications Prior to Admission medications   Medication Sig Start Date End Date Taking? Authorizing Provider  acetaminophen (TYLENOL) 500 MG tablet Take  1,000 mg by mouth every 6 (six) hours as needed for mild pain or headache.    [provider]  acetaminophen-codeine (TYLENOL #4) 300-60 MG per tablet Take 1 tablet by mouth every 4 (four) hours as needed for pain. Patient not taking: Reported on 05/31/2014 03/26/12   Jimmie Molly, MD  chlorhexidine (PERIDEX) 0.12 % solution Use as directed 15 mLs in the mouth or throat 4 (four) times daily. Swish thoroughly and spit. Patient not taking: Reported on 05/31/2014 12/11/13   Linna Hoff, MD  cyclobenzaprine (FLEXERIL) 10 MG tablet Take 1 tablet (10 mg total) by mouth at bedtime. 06/09/16   Deatra Canter, FNP  methocarbamol (ROBAXIN) 500 MG tablet Take 2 tablets (1,000 mg total) by mouth 4 (four) times daily as needed (Pain). 06/03/14   Pisciotta, Joni Reining, PA-C  Multiple Vitamin (MULTIVITAMIN WITH MINERALS) TABS tablet Take 1 tablet by mouth daily.    [provider]  naproxen (NAPROSYN) 500 MG tablet Take 1 tablet (500 mg total) by mouth 2 (two) times daily with a meal. 06/09/16   [place], Anselm Pancoast, FNP  predniSONE (DELTASONE) 10 MG tablet Take 2 tablets (20 mg total) by mouth daily. 06/01/14   Bethann Berkshire, MD  prochlorperazine (COMPAZINE) 10 MG tablet Take 1 tablet (10 mg total) by mouth every 6 (six) hours as needed for nausea (Headache). 06/03/14  Pisciotta, Joni Reining, PA-C  promethazine (PHENERGAN) 25 MG tablet Take 1 tablet (25 mg total) by mouth every 6 (six) hours as needed for nausea or vomiting. 06/01/14   Bethann Berkshire, MD  sucralfate (CARAFATE) 1 g tablet Take 1 tablet (1 g total) by mouth 4 (four) times daily -  with meals and at bedtime. 12/14/18   Ward, Chase Picket, PA-C  traMADol (ULTRAM) 50 MG tablet Take 1 tablet (50 mg total) by mouth every 6 (six) hours as needed. 06/01/14   Bethann Berkshire, MD  triamcinolone (KENALOG) 0.1 % paste Use on gums qid Patient not taking: Reported on 05/31/2014 12/11/13   Linna Hoff, MD    Allergies    Patient has no known  allergies.  Review of Systems   Review of Systems  Constitutional: Positive for fever.  Respiratory: Negative for cough and shortness of breath.   Cardiovascular: Negative for chest pain.  Gastrointestinal: Positive for abdominal pain, diarrhea, nausea and vomiting.  Genitourinary: Negative for dysuria and hematuria.  Neurological: Negative for headaches.  All other systems reviewed and are negative.   Physical Exam Updated Vital Signs BP 133/81   Pulse 98   Temp (!) 102.1 F (38.9 C) (Oral)   Resp 20   Ht 5\' 2"  (1.575 m)   Wt 55.3 kg   LMP 01/22/2020 (Approximate)   SpO2 99%   BMI 22.31 kg/m   Physical Exam Vitals and nursing note reviewed.  Constitutional:      Appearance: Normal appearance. She is well-developed.     Comments: Appears uncomfortable  HENT:     Head: Normocephalic and atraumatic.  Eyes:     General: Lids are normal.     Conjunctiva/sclera: Conjunctivae normal.     Pupils: Pupils are equal, round, and reactive to light.  Cardiovascular:     Rate and Rhythm: Normal rate and regular rhythm.     Pulses: Normal pulses.     Heart sounds: Normal heart sounds. No murmur heard.  No friction rub. No gallop.   Pulmonary:     Effort: Pulmonary effort is normal.     Breath sounds: Normal breath sounds.     Comments: Lungs clear to auscultation bilaterally.  Symmetric chest rise.  No wheezing, rales, rhonchi. Abdominal:     Palpations: Abdomen is soft. Abdomen is not rigid.     Tenderness: There is abdominal tenderness in the right lower quadrant. There is right CVA tenderness.     Comments: Abdomen soft, nondistended.  Tenderness palpation of the right lower quadrant particularly at McBurney's point.  Right-sided CVA tenderness noted.  No rigidity but she does have some voluntary guarding.  Musculoskeletal:        General: Normal range of motion.     Cervical back: Full passive range of motion without pain.  Skin:    General: Skin is warm and dry.      Capillary Refill: Capillary refill takes less than 2 seconds.  Neurological:     Mental Status: She is alert and oriented to person, place, and time.  Psychiatric:        Speech: Speech normal.     ED Results / Procedures / Treatments   Labs (all labs ordered are listed, but only abnormal results are displayed) Labs Reviewed  CBC - Abnormal; Notable for the following components:      Result Value   WBC 12.3 (*)    HCT 35.9 (*)    All other components within normal limits  RESPIRATORY  PANEL BY RT PCR (FLU A&B, COVID)  LIPASE, BLOOD  COMPREHENSIVE METABOLIC PANEL  URINALYSIS, ROUTINE W REFLEX MICROSCOPIC  HCG, QUANTITATIVE, PREGNANCY    EKG None  Radiology No results found.  Procedures Procedures (including critical care time)  Medications Ordered in ED Medications  ondansetron (ZOFRAN) injection 4 mg (has no administration in time range)  acetaminophen (TYLENOL) tablet 1,000 mg (1,000 mg Oral Given 02/11/20 0630)  sodium chloride 0.9 % bolus 1,000 mL (1,000 mLs Intravenous New Bag/Given 02/11/20 0630)  ondansetron (ZOFRAN) injection 4 mg (4 mg Intravenous Given 02/11/20 3846)  morphine 4 MG/ML injection 4 mg (4 mg Intravenous Given 02/11/20 0631)    ED Course  I have reviewed the triage vital signs and the nursing notes.  Pertinent labs & imaging results that were available during my care of the patient were reviewed by me and considered in my medical decision making (see chart for details).    MDM Rules/Calculators/A&P                          39 year old female who presents for evaluation of abdominal pain, nausea, low-grade fever x4 days.  Vomiting started yesterday and had an episode tonight and some streaks of blood in it.  She is not on blood thinners.  She has had some occasional diarrhea.  Initially arrival, she is febrile at 102.1.  Vitals otherwise stable.  On exam, she has tenderness noted particularly at McBurney's point noted in the right lower quadrant.   She also has some right-sided CVA tenderness.  Concern for infectious process such as appendicitis.  Also consider GU etiology such as nephritis versus UTI but patient not have any urinary symptoms.  Plan for labs, pain medications, fluids. Will obtain CT scan.   CBC shows leukocytosis of 12.3.  Patient signed out to Berle Mull, PA-C pending labs and CT scan.     Final Clinical Impression(s) / ED Diagnoses Final diagnoses:  Lower abdominal pain  Nausea and vomiting, intractability of vomiting not specified, unspecified vomiting type    Rx / DC Orders ED Discharge Orders    None       Rosana Hoes 02/11/20 [id number]    Nira Conn, MD 02/11/20 [phone]

## 2020-02-11 NOTE — Discharge Instructions (Addendum)
You have a UTI as well as pyelonephritis.  I have started you on antibiotics please take as prescribed.  I recommend staying hydrated, please drink plenty of fluids as this will help with your symptoms.  For pain I recommend over-the-counter pain medications like ibuprofen or Tylenol every 6 as needed.  Please follow dosing the back of bottle.  It is imperative that you follow-up with your primary care provider in 5 days for reevaluation.  You may come back here or go to an urgent care.  Come back to emergency department if you develop worsening fevers, worsening abdominal pain, uncontrolled nausea or vomiting, chest pain, shortness of breath, as he sent for further evaluation management.

## 2020-02-13 LAB — URINE CULTURE: Culture: >=100000 — AB

## 2020-02-14 ENCOUNTER — Telehealth (HOSPITAL_BASED_OUTPATIENT_CLINIC_OR_DEPARTMENT_OTHER): Payer: Self-pay | Admitting: Emergency Medicine

## 2020-02-14 NOTE — Telephone Encounter (Signed)
Post ED Visit - Positive Culture Follow-up  Culture report reviewed by antimicrobial stewardship pharmacist: [facility] []  , Pharm.D. []  Enzo Bi, Pharm.D., BCPS AQ-ID []  , Pharm.D., BCPS []  Celedonio Miyamoto, .D., BCPS []  [place], .D., BCPS, AAHIVP []  Georgina Pillion, Pharm.D., BCPS, AAHIVP []  [address], PharmD, BCPS []  , PharmD, BCPS []  [place], PharmD, BCPS []  [place], PharmD []  , PharmD, BCPS []  Estella Husk, PharmD  Pharmacy Team []  Lysle Pearl, PharmD []  , PharmD [x]  Phillips Climes, PharmD []  , Rph []  Agapito Games) , PharmD []  Verlan Friends, PharmD []  , PharmD []  Mervyn Gay, PharmD []  , PharmD []  Vinnie Level, PharmD []  [facility], PharmD []  , PharmD []  Len Childs, PharmD   Positive urine culture Treated with Cephalexin, organism sensitive to the same and no further patient follow-up is required at this time.  Oreta Soloway 02/14/2020, 2:04 PM

## 2020-03-16 ENCOUNTER — Ambulatory Visit (INDEPENDENT_AMBULATORY_CARE_PROVIDER_SITE_OTHER): Payer: Managed Care, Other (non HMO) | Admitting: Nurse Practitioner

## 2020-03-16 ENCOUNTER — Other Ambulatory Visit: Payer: Self-pay

## 2020-03-16 ENCOUNTER — Encounter: Payer: Self-pay | Admitting: Nurse Practitioner

## 2020-03-16 ENCOUNTER — Other Ambulatory Visit (HOSPITAL_COMMUNITY)
Admission: RE | Admit: 2020-03-16 | Discharge: 2020-03-16 | Disposition: A | Discharge status: Home / Self Care | Payer: Managed Care, Other (non HMO) | Source: Ambulatory Visit | Attending: Nurse Practitioner | Admitting: Nurse Practitioner

## 2020-03-16 VITALS — BP 120/84 | HR 74 | Temp 97.5°F | Ht 62.0 in | Wt 119.4 lb

## 2020-03-16 DIAGNOSIS — Z113 Encounter for screening for infections with a predominantly sexual mode of transmission: Secondary | ICD-10-CM | POA: Insufficient documentation

## 2020-03-16 DIAGNOSIS — R748 Abnormal levels of other serum enzymes: Secondary | ICD-10-CM | POA: Insufficient documentation

## 2020-03-16 DIAGNOSIS — Z136 Encounter for screening for cardiovascular disorders: Secondary | ICD-10-CM | POA: Diagnosis not present

## 2020-03-16 DIAGNOSIS — B9689 Other specified bacterial agents as the cause of diseases classified elsewhere: Secondary | ICD-10-CM

## 2020-03-16 DIAGNOSIS — Z0001 Encounter for general adult medical examination with abnormal findings: Secondary | ICD-10-CM | POA: Insufficient documentation

## 2020-03-16 DIAGNOSIS — Z23 Encounter for immunization: Secondary | ICD-10-CM

## 2020-03-16 DIAGNOSIS — Z13220 Encounter for screening for lipoid disorders: Secondary | ICD-10-CM | POA: Diagnosis not present

## 2020-03-16 DIAGNOSIS — Z124 Encounter for screening for malignant neoplasm of cervix: Secondary | ICD-10-CM | POA: Diagnosis present

## 2020-03-16 DIAGNOSIS — N760 Acute vaginitis: Secondary | ICD-10-CM

## 2020-03-16 DIAGNOSIS — Z1231 Encounter for screening mammogram for malignant neoplasm of breast: Secondary | ICD-10-CM

## 2020-03-16 LAB — HEPATIC FUNCTION PANEL
ALT: 10 U/L (ref 0–35)
AST: 16 U/L (ref 0–37)
Albumin: 4.3 g/dL (ref 3.5–5.2)
Alkaline Phosphatase: 44 U/L (ref 39–117)
Bilirubin, Direct: 0.1 mg/dL (ref 0.0–0.3)
Total Bilirubin: 0.6 mg/dL (ref 0.2–1.2)
Total Protein: 6.8 g/dL (ref 6.0–8.3)

## 2020-03-16 LAB — LIPID PANEL
Cholesterol: 146 mg/dL (ref 0–200)
HDL: 65.7 mg/dL (ref >39.00)
LDL Cholesterol: 68 mg/dL (ref 0–99)
NonHDL: 80.54
Total CHOL/HDL Ratio: 2
Triglycerides: 61 mg/dL (ref 0.0–149.0)
VLDL: 12.2 mg/dL (ref 0.0–40.0)

## 2020-03-16 LAB — BASIC METABOLIC PANEL
BUN: 6 mg/dL (ref 6–23)
CO2: 31 mEq/L (ref 19–32)
Calcium: 8.7 mg/dL (ref 8.4–10.5)
Chloride: 103 mEq/L (ref 96–112)
Creatinine, Ser: 0.69 mg/dL (ref 0.40–1.20)
GFR: 108.75 mL/min (ref >60.00)
Glucose, Bld: 93 mg/dL (ref 70–99)
Potassium: 3.4 mEq/L — ABNORMAL LOW (ref 3.5–5.1)
Sodium: 140 mEq/L (ref 135–145)

## 2020-03-16 NOTE — Assessment & Plan Note (Addendum)
No ETOH  Or illicit drug use No OTC medication No juandice, no ABD pain or nausea or weight loss. Hepatic Function Latest Ref Rng & Units 03/16/2020 02/11/2020 12/14/2018  Total Protein 6.0 - 8.3 g/dL 6.8 6.7 7.4  Albumin 3.5 - 5.2 g/dL 4.3 3.4(L) 4.6  AST 0 - 37 U/L 16 151(H) 19  ALT 0 - 35 U/L 10 197(H) 16  Alk Phosphatase 39 - 117 U/L 44 112 39  Total Bilirubin 0.2 - 1.2 mg/dL 0.6 0.8 0.4  Bilirubin, Direct 0.0 - 0.3 mg/dL 0.1 - -   Repeat hepatic panel today 

## 2020-03-16 NOTE — Progress Notes (Signed)
Subjective:    Patient ID: Shannon Marks, female    DOB: [birth year], 39 y.o.   MRN: [id number]  Patient presents today for CPE and  eval of LFT  HPI Elevated liver enzymes No ETOH  Or illicit drug use No OTC medication No juandice, no ABD pain or nausea or weight loss. Hepatic Function Latest Ref Rng & Units 03/16/2020 02/11/2020 12/14/2018  Total Protein 6.0 - 8.3 g/dL 6.8 6.7 7.4  Albumin 3.5 - 5.2 g/dL 4.3 3.4(L) 4.6  AST 0 - 37 U/L 16 151(H) 19  ALT 0 - 35 U/L 10 197(H) 16  Alk Phosphatase 39 - 117 U/L 44 112 39  Total Bilirubin 0.2 - 1.2 mg/dL 0.6 0.8 0.4  Bilirubin, Direct 0.0 - 0.3 mg/dL 0.1 - -   Repeat hepatic panel today   Sexual History (orientation,birth control, marital status, STD):s/p tubal ligation, sexually active, wants breast and pelvic exam, also requested for STD screen  Depression/Suicide: Depression screen [employer] 2/9 03/16/2020  Decreased Interest 0  Down, Depressed, Hopeless 0  PHQ - 2 Score 0  Altered sleeping 0  Tired, decreased energy 3  Change in appetite 3  Feeling bad or failure about yourself  0  Trouble concentrating 1  Moving slowly or fidgety/restless 1  Suicidal thoughts 0  PHQ-9 Score 8  Difficult doing work/chores Not difficult at all   Vision:not needed  Dental:not needed  Immunizations: (TDAP, Hep C screen, Pneumovax, Influenza, zoster)  Health Maintenance  Topic Date Due  .  Hepatitis C: One time screening is recommended by Center for Disease Control  (CDC) for  adults born from [birth year] through 1966.   Never done  . COVID-19 Vaccine (1) Never done  . HIV Screening  Never done  . Pap Smear  Never done  . Tetanus Vaccine  03/16/2030  . Flu Shot  Completed   Diet:regular.  Weight:  Wt Readings from Last 3 Encounters:  03/16/20 119 lb 6.4 oz (54.2 kg)  02/11/20 122 lb (55.3 kg)  12/14/18 125 lb (56.7 kg)   Medications and allergies reviewed with patient and updated if appropriate.  Patient Active Problem List    Diagnosis Date Noted  . Elevated liver enzymes 03/16/2020    No current outpatient medications on file prior to visit.   No current facility-administered medications on file prior to visit.    History reviewed. No pertinent past medical history.  Past Surgical History:  Procedure Laterality Date  . TUBAL LIGATION      Social History   Socioeconomic History  . Marital status: Single    Spouse name: Not on file  . Number of children: Not on file  . Years of education: Not on file  . Highest education level: Not on file  Occupational History  . Not on file  Tobacco Use  . Smoking status: Never Smoker  . Smokeless tobacco: Never Used  Vaping Use  . Vaping Use: Never used  Substance and Sexual Activity  . Alcohol use: No  . Drug use: No  . Sexual activity: Yes    Birth control/protection: Surgical  Other Topics Concern  . Not on file  Social History Narrative  . Not on file   Social Determinants of Health   Financial Resource Strain:   . Difficulty of Paying Living Expenses: Not on file  Food Insecurity:   . Worried About Charity fundraiser in the Last Year: Not on file  . Ran Out of Food in the Last  Year: Not on file  Transportation Needs:   . Lack of Transportation (Medical): Not on file  . Lack of Transportation (Non-Medical): Not on file  Physical Activity:   . Days of Exercise per Week: Not on file  . Minutes of Exercise per Session: Not on file  Stress:   . Feeling of Stress : Not on file  Social Connections:   . Frequency of Communication with Friends and Family: Not on file  . Frequency of Social Gatherings with Friends and Family: Not on file  . Attends Religious Services: Not on file  . Active Member of Clubs or Organizations: Not on file  . Attends Archivist Meetings: Not on file  . Marital Status: Not on file    Family History  Family history unknown: Yes        Review of Systems  Constitutional: Negative for fever,  malaise/fatigue and weight loss.  HENT: Negative for congestion and sore throat.   Eyes:       Negative for visual changes  Respiratory: Negative for cough and shortness of breath.   Cardiovascular: Negative for chest pain, palpitations and leg swelling.  Gastrointestinal: Negative for blood in stool, constipation, diarrhea and heartburn.  Genitourinary: Negative for dysuria, frequency and urgency.  Musculoskeletal: Negative for falls, joint pain and myalgias.  Skin: Negative for rash.  Neurological: Negative for dizziness, sensory change and headaches.  Endo/Heme/Allergies: Does not bruise/bleed easily.  Psychiatric/Behavioral: Positive for depression. Negative for hallucinations, memory loss, substance abuse and suicidal ideas. The patient is nervous/anxious. The patient does not have insomnia.     Objective:   Vitals:   03/16/20 0942  BP: 120/84  Pulse: 74  Temp: (!) 97.5 F (36.4 C)  SpO2: 97%    Body mass index is 21.84 kg/m.   Physical Examination:  Physical Exam Vitals and nursing note reviewed. Exam conducted with a chaperone present.  Constitutional:      General: She is not in acute distress.    Appearance: She is well-developed.  HENT:     Right Ear: Tympanic membrane, ear canal and external ear normal.     Left Ear: Tympanic membrane, ear canal and external ear normal.  Eyes:     Extraocular Movements: Extraocular movements intact.     Conjunctiva/sclera: Conjunctivae normal.  Cardiovascular:     Rate and Rhythm: Normal rate and regular rhythm.     Pulses: Normal pulses.     Heart sounds: Normal heart sounds.  Pulmonary:     Effort: Pulmonary effort is normal. No respiratory distress.     Breath sounds: Normal breath sounds.  Chest:     Chest wall: No tenderness.  Abdominal:     General: Bowel sounds are normal.     Palpations: Abdomen is soft.  Genitourinary:    General: Normal vulva.     Exam position: Supine.     Labia:        Right: No rash  or tenderness.        Left: No rash.      Vagina: Normal.     Cervix: Normal.     Uterus: Normal.      Adnexa: Right adnexa normal and left adnexa normal.     Rectum: No anal fissure or external hemorrhoid.  Musculoskeletal:        General: Normal range of motion.     Cervical back: Normal range of motion and neck supple.     Right lower leg: No edema.  Left lower leg: No edema.  Lymphadenopathy:     Cervical: No cervical adenopathy.     Lower Body: No right inguinal adenopathy.  Skin:    General: Skin is warm and dry.  Neurological:     Mental Status: She is alert and oriented to person, place, and time.     Deep Tendon Reflexes: Reflexes are normal and symmetric.  Psychiatric:        Mood and Affect: Mood normal.        Behavior: Behavior normal.        Thought Content: Thought content normal.     ASSESSMENT and PLAN: This visit occurred during the SARS-CoV-2 public health emergency.  Safety protocols were in place, including screening questions prior to the visit, additional usage of staff PPE, and extensive cleaning of exam room while observing appropriate contact time as indicated for disinfecting solutions.   Shannon Marks was seen today for establish care.  Diagnoses and all orders for this visit:  Encounter for preventative adult health care exam with abnormal findings -     Basic metabolic panel -     Lipid panel -     Cytology - PAP( [place]) -     MM DIGITAL SCREENING BILATERAL; Future -     HIV Antibody (routine testing w rflx) -     Hepatitis C Antibody  Influenza vaccine needed -     Flu Vaccine QUAD 6+ mos PF IM (Fluarix Quad PF)  Need for diphtheria-tetanus-pertussis (Tdap) vaccine -     Tdap vaccine greater than or equal to 7yo IM  Encounter for lipid screening for cardiovascular disease -     Lipid panel  Elevated liver enzymes -     Hepatic function panel  Screen for STD (sexually transmitted disease) -     Hepatitis C Antibody -      Cervicovaginal ancillary only( [place]) -     RPR  Encounter for Papanicolaou smear for cervical cancer screening -     Cytology - PAP( [place])  Breast cancer screening by mammogram -     HIV Antibody (routine testing w rflx)        Problem List Items Addressed This Visit      Other   Elevated liver enzymes    No ETOH  Or illicit drug use No OTC medication No juandice, no ABD pain or nausea or weight loss. Hepatic Function Latest Ref Rng & Units 03/16/2020 02/11/2020 12/14/2018  Total Protein 6.0 - 8.3 g/dL 6.8 6.7 7.4  Albumin 3.5 - 5.2 g/dL 4.3 3.4(L) 4.6  AST 0 - 37 U/L 16 151(H) 19  ALT 0 - 35 U/L 10 197(H) 16  Alk Phosphatase 39 - 117 U/L 44 112 39  Total Bilirubin 0.2 - 1.2 mg/dL 0.6 0.8 0.4  Bilirubin, Direct 0.0 - 0.3 mg/dL 0.1 - -   Repeat hepatic panel today      Relevant Orders   Hepatic function panel (Completed)    Other Visit Diagnoses    Encounter for preventative adult health care exam with abnormal findings    -  Primary   Relevant Orders   Basic metabolic panel (Completed)   Lipid panel (Completed)   Cytology - PAP( [place])   MM DIGITAL SCREENING BILATERAL   HIV Antibody (routine testing w rflx)   Hepatitis C Antibody   Influenza vaccine needed       Relevant Orders   Flu Vaccine QUAD 6+ mos PF IM (Fluarix  Quad PF) (Completed)   Need for diphtheria-tetanus-pertussis (Tdap) vaccine       Relevant Orders   Tdap vaccine greater than or equal to 7yo IM (Completed)   Encounter for lipid screening for cardiovascular disease       Relevant Orders   Lipid panel (Completed)   Screen for STD (sexually transmitted disease)       Relevant Orders   Hepatitis C Antibody   Cervicovaginal ancillary only( [place])   RPR   Encounter for Papanicolaou smear for cervical cancer screening       Relevant Orders   Cytology - PAP( )   Breast cancer screening by mammogram       Relevant Orders   HIV Antibody (routine testing w rflx)        Follow up: Return in about 1 year (around 03/16/2021) for CPE ([place]).  Wilfred Lacy, NP

## 2020-03-16 NOTE — Patient Instructions (Signed)
Thank you for choosing [facility] for your health needs.  Go to lab for blood draw  You will be contacted to schedule appt for mammogram.  Health Maintenance, Female Adopting a healthy lifestyle and getting preventive care are important in promoting health and wellness. Ask your health care provider about:  The right schedule for you to have regular tests and exams.  Things you can do on your own to prevent diseases and keep yourself healthy. What should I know about diet, weight, and exercise? Eat a healthy diet   Eat a diet that includes plenty of vegetables, fruits, low-fat dairy products, and lean protein.  Do not eat a lot of foods that are high in solid fats, added sugars, or sodium. Maintain a healthy weight Body mass index (BMI) is used to identify weight problems. It estimates body fat based on height and weight. Your health care provider can help determine your BMI and help you achieve or maintain a healthy weight. Get regular exercise Get regular exercise. This is one of the most important things you can do for your health. Most adults should:  Exercise for at least 150 minutes each week. The exercise should increase your heart rate and make you sweat (moderate-intensity exercise).  Do strengthening exercises at least twice a week. This is in addition to the moderate-intensity exercise.  Spend less time sitting. Even light physical activity can be beneficial. Watch cholesterol and blood lipids Have your blood tested for lipids and cholesterol at 40 years of age, then have this test every 5 years. Have your cholesterol levels checked more often if:  Your lipid or cholesterol levels are high.  You are older than 40 years of age.  You are at high risk for heart disease. What should I know about cancer screening? Depending on your health history and family history, you may need to have cancer screening at various ages. This may include screening for:  Breast  cancer.  Cervical cancer.  Colorectal cancer.  Skin cancer.  Lung cancer. What should I know about heart disease, diabetes, and high blood pressure? Blood pressure and heart disease  High blood pressure causes heart disease and increases the risk of stroke. This is more likely to develop in people who have high blood pressure readings, are of African descent, or are overweight.  Have your blood pressure checked: ? Every 3-5 years if you are 41-56 years of age. ? Every year if you are 107 years old or older. Diabetes Have regular diabetes screenings. This checks your fasting blood sugar level. Have the screening done:  Once every three years after age 81 if you are at a normal weight and have a low risk for diabetes.  More often and at a younger age if you are overweight or have a high risk for diabetes. What should I know about preventing infection? Hepatitis B If you have a higher risk for hepatitis B, you should be screened for this virus. Talk with your health care provider to find out if you are at risk for hepatitis B infection. Hepatitis C Testing is recommended for:  Everyone born from [birth year] through 1966.  Anyone with known risk factors for hepatitis C. Sexually transmitted infections (STIs)  Get screened for STIs, including gonorrhea and chlamydia, if: ? You are sexually active and are younger than 40 years of age. ? You are older than 40 years of age and your health care provider tells you that you are at risk for this type  of infection. ? Your sexual activity has changed since you were last screened, and you are at increased risk for chlamydia or gonorrhea. Ask your health care provider if you are at risk.  Ask your health care provider about whether you are at high risk for HIV. Your health care provider may recommend a prescription medicine to help prevent HIV infection. If you choose to take medicine to prevent HIV, you should first get tested for HIV. You should  then be tested every 3 months for as long as you are taking the medicine. Pregnancy  If you are about to stop having your period (premenopausal) and you may become pregnant, seek counseling before you get pregnant.  Take 400 to 800 micrograms (mcg) of folic acid every day if you become pregnant.  Ask for birth control (contraception) if you want to prevent pregnancy. Osteoporosis and menopause Osteoporosis is a disease in which the bones lose minerals and strength with aging. This can result in bone fractures. If you are 59 years old or older, or if you are at risk for osteoporosis and fractures, ask your health care provider if you should:  Be screened for bone loss.  Take a calcium or vitamin D supplement to lower your risk of fractures.  Be given hormone replacement therapy (HRT) to treat symptoms of menopause. Follow these instructions at home: Lifestyle  Do not use any products that contain nicotine or tobacco, such as cigarettes, e-cigarettes, and chewing tobacco. If you need help quitting, ask your health care provider.  Do not use street drugs.  Do not share needles.  Ask your health care provider for help if you need support or information about quitting drugs. Alcohol use  Do not drink alcohol if: ? Your health care provider tells you not to drink. ? You are pregnant, may be pregnant, or are planning to become pregnant.  If you drink alcohol: ? Limit how much you use to 0-1 drink a day. ? Limit intake if you are breastfeeding.  Be aware of how much alcohol is in your drink. In the [place] one drink equals one 12 oz bottle of beer (355 mL), one 5 oz glass of wine (148 mL), or one 1 oz glass of hard liquor (44 mL). General instructions  Schedule regular health, dental, and eye exams.  Stay current with your vaccines.  Tell your health care provider if: ? You often feel depressed. ? You have ever been abused or do not feel safe at home. Summary  Adopting a  healthy lifestyle and getting preventive care are important in promoting health and wellness.  Follow your health care provider's instructions about healthy diet, exercising, and getting tested or screened for diseases.  Follow your health care provider's instructions on monitoring your cholesterol and blood pressure. This information is not intended to replace advice given to you by your health care provider. Make sure you discuss any questions you have with your health care provider. Document Revised: 04/11/2018 Document Reviewed: 04/11/2018 Elsevier Patient Education  2020 [employer].

## 2020-03-17 LAB — HEPATITIS C ANTIBODY
Hepatitis C Ab: NONREACTIVE
SIGNAL TO CUT-OFF: 0.01 (ref <1.00)

## 2020-03-17 LAB — CERVICOVAGINAL ANCILLARY ONLY
Chlamydia: NEGATIVE
Comment: NEGATIVE
Comment: NEGATIVE
Comment: NORMAL
Neisseria Gonorrhea: NEGATIVE
Trichomonas: NEGATIVE

## 2020-03-17 LAB — RPR: RPR Ser Ql: NONREACTIVE

## 2020-03-17 LAB — HIV ANTIBODY (ROUTINE TESTING W REFLEX): HIV 1&2 Ab, 4th Generation: NONREACTIVE

## 2020-03-19 LAB — CYTOLOGY - PAP
Adequacy: ABSENT
Comment: NEGATIVE
Diagnosis: NEGATIVE
High risk HPV: NEGATIVE

## 2020-03-20 ENCOUNTER — Encounter: Payer: Self-pay | Admitting: Nurse Practitioner

## 2020-03-20 MED ORDER — METRONIDAZOLE 0.75 % VA GEL: 1.0000 | Freq: Every day | VAGINAL | 0 refills | Status: DC

## 2020-03-20 NOTE — Addendum Note (Signed)
Addended by: Alysia Penna L on: 03/20/2020 03:59 PM   Modules accepted: Orders

## 2020-05-04 ENCOUNTER — Other Ambulatory Visit: Payer: Self-pay

## 2020-05-04 ENCOUNTER — Encounter: Payer: Self-pay | Admitting: Nurse Practitioner

## 2020-05-04 ENCOUNTER — Ambulatory Visit: Payer: 59 | Admitting: Nurse Practitioner

## 2020-05-04 VITALS — BP 124/82 | HR 74 | Temp 96.8°F | Ht 62.0 in | Wt 121.8 lb

## 2020-05-04 DIAGNOSIS — N920 Excessive and frequent menstruation with regular cycle: Secondary | ICD-10-CM

## 2020-05-04 MED ORDER — NORETHIN ACE-ETH ESTRAD-FE 1-20 MG-MCG PO TABS: 1.0000 | ORAL_TABLET | Freq: Every day | ORAL | 3 refills | Status: DC

## 2020-05-04 NOTE — Progress Notes (Signed)
Subjective:  Patient ID: [name], female    DOB: 1981-01-29  Age: 40 y.o. MRN: [id number]  CC: Acute Visit (Pt to discuss birth control- pt has had a tubal ligation, but states her cycles she is still heavy bleeding, nauseas and dizzy. )  Vaginal Bleeding The patient's primary symptoms include vaginal bleeding. The patient's pertinent negatives include no genital itching, genital lesions, genital odor, genital rash, missed menses, pelvic pain or vaginal discharge. This is a recurrent problem. The current episode started more than 1 month ago. The problem occurs intermittently. The problem has been gradually worsening. The patient is experiencing no pain. She is not pregnant. Pertinent negatives include no abdominal pain, anorexia, back pain, chills, flank pain, frequency, hematuria or painful intercourse. The vaginal discharge was bloody. The vaginal bleeding is heavier than menses. She has been passing clots. She has not been passing tissue. Nothing aggravates the symptoms. She has tried nothing for the symptoms. She is sexually active. No, her partner does not have an STD. She uses tubal ligation for contraception. Her menstrual history has been regular. Her past medical history is significant for menorrhagia. There is no history of endometriosis, metrorrhagia, miscarriage, PID, an STD or vaginosis.  menstrual cycle every 25days, some clots size of a penny. She will like to resume OCP to minimize bleeding. No FHx or personal hx of breast cancer or PE/DVT or ovarian cancer. No tobacco use.  Reviewed past Medical, Social and Family history today.  Outpatient Medications Prior to Visit  Medication Sig Dispense Refill  . metroNIDAZOLE (METROGEL VAGINAL) 0.75 % vaginal gel Place 1 Applicatorful vaginally at bedtime. (Patient not taking: Reported on 05/04/2020) 70 g 0   No facility-administered medications prior to visit.    ROS See HPI  Objective:  BP 124/82 (BP Location: Left Arm,  Patient Position: Sitting, Cuff Size: Normal)   Pulse 74   Temp (!) 96.8 F (36 C) (Temporal)   Ht 5\' 2"  (1.575 m)   Wt 121 lb 12.8 oz (55.2 kg)   LMP 04/25/2020 (Exact Date)   SpO2 99%   BMI 22.28 kg/m   Physical Exam Cardiovascular:     Pulses: Normal pulses.  Pulmonary:     Effort: Pulmonary effort is normal.  Neurological:     Mental Status: She is alert and oriented to person, place, and time.  Psychiatric:        Mood and Affect: Mood normal.        Behavior: Behavior normal.        Thought Content: Thought content normal.    Assessment & Plan:  This visit occurred during the SARS-CoV-2 public health emergency.  Safety protocols were in place, including screening questions prior to the visit, additional usage of staff PPE, and extensive cleaning of exam room while observing appropriate contact time as indicated for disinfecting solutions.   Hamda was seen today for acute visit.  Diagnoses and all orders for this visit:  Menorrhagia with regular cycle -     norethindrone-ethinyl estradiol (JUNEL FE 1/20) 1-20 MG-MCG tablet; Take 1 tablet by mouth daily.  provided information on different hormonal therapy and possible side effects. She decided to use combined oral contraception.  Problem List Items Addressed This Visit   None   Visit Diagnoses    Menorrhagia with regular cycle    -  Primary   Relevant Medications   norethindrone-ethinyl estradiol (JUNEL FE 1/20) 1-20 MG-MCG tablet      Follow-up: Return in about 21  months (around 02/08/2022) for CPE (fasting).  Alysia Penna, NP

## 2020-05-04 NOTE — Patient Instructions (Signed)
Start on first day of next cycle.  Ethinyl Estradiol; Norethindrone Acetate; Ferrous fumarate tablets or capsules What is this medicine? ETHINYL ESTRADIOL; NORETHINDRONE ACETATE; FERROUS FUMARATE (ETH in il es tra DYE ole; nor eth IN drone AS e tate; FER [place] FUE ma rate) is an oral contraceptive. The products combine two types of female hormones, an estrogen and a progestin. They are used to prevent ovulation and pregnancy. Some products are also used to treat acne in females. This medicine may be used for other purposes; ask your health care provider or pharmacist if you have questions. COMMON BRAND NAME(S): Aurovela [address] 1/20, Aurovela Fe, [place] [address], [address] Fe, [place] Fe, [address], [place] Fe 1.5/30, [place] Fe 1/20, Hailey 24 Fe, Hailey Fe 1.5/30, Junel Fe 1.5/30, Junel Fe 1/20, Junel Fe 24, [place] Fe, Lo Loestrin Fe, Loestrin 24 Fe, Loestrin FE 1.5/30, Loestrin FE 1/20, [place] 24 Fe, Microgestin 24 Fe, Microgestin Fe 1.5/30, Microgestin Fe 1/20, Tarina 24 Fe, Tarina Fe 1/20, [place], Tilia Fe, Tri-Legest Fe What should I tell my health care provider before I take this medicine? They need to know if you have any of these conditions:  abnormal vaginal bleeding  blood vessel disease  breast, cervical, endometrial, ovarian, liver, or uterine cancer  diabetes  gallbladder disease  heart disease or recent heart attack  high blood pressure  high cholesterol  history of blood clots  kidney disease  liver disease  migraine headaches  smoke tobacco  stroke  systemic lupus erythematosus (SLE)  an unusual or allergic reaction to estrogens, progestins, other medicines, foods, dyes, or preservatives  pregnant or trying to get pregnant  breast-feeding How should I use this medicine? Take this medicine by mouth. To reduce nausea, this medicine may be taken with food. Follow the directions on the prescription label. Take this medicine at the same time each day and in  the order directed on the package. Do not take your medicine more often than directed. A patient package insert for the product will be given with each prescription and refill. Read this sheet carefully each time. The sheet may change frequently. Contact your pediatrician regarding the use of this medicine in children. Special care may be needed. This medicine has been used in female children who have started having menstrual periods. Overdosage: If you think you have taken too much of this medicine contact a poison control center or emergency room at once. NOTE: This medicine is only for you. Do not share this medicine with others. What if I miss a dose? If you miss a dose, refer to the patient information sheet you received with your medicine for direction. If you miss more than one pill, this medicine may not be as effective and you may need to use another form of birth control. What may interact with this medicine? Do not take this medicine with the following medication:  dasabuvir; ombitasvir; paritaprevir; ritonavir  ombitasvir; paritaprevir; ritonavir This medicine may also interact with the following medications:  acetaminophen  antibiotics or medicines for infections, especially rifampin, rifabutin, rifapentine, and griseofulvin, and possibly penicillins or tetracyclines  aprepitant  ascorbic acid (vitamin C)  atorvastatin  barbiturate medicines, such as phenobarbital  bosentan  carbamazepine  caffeine  clofibrate  cyclosporine  dantrolene  doxercalciferol  felbamate  grapefruit juice  hydrocortisone  medicines for anxiety or sleeping problems, such as diazepam or temazepam  medicines for diabetes, including pioglitazone  mineral oil  modafinil  mycophenolate  nefazodone  oxcarbazepine  phenytoin  prednisolone  ritonavir or other medicines for HIV infection or AIDS  rosuvastatin  selegiline  soy isoflavones supplements  St. John's  wort  tamoxifen or raloxifene  theophylline  thyroid hormones  topiramate  warfarin This list may not describe all possible interactions. Give your health care provider a list of all the medicines, herbs, non-prescription drugs, or dietary supplements you use. Also tell them if you smoke, drink alcohol, or use illegal drugs. Some items may interact with your medicine. What should I watch for while using this medicine? Visit your doctor or health care professional for regular checks on your progress. You will need a regular breast and pelvic exam and Pap smear while on this medicine. Use an additional method of contraception during the first cycle that you take these tablets. If you have any reason to think you are pregnant, stop taking this medicine right away and contact your doctor or health care professional. If you are taking this medicine for hormone related problems, it may take several cycles of use to see improvement in your condition. Smoking increases the risk of getting a blood clot or having a stroke while you are taking birth control pills, especially if you are more than 41 years old. You are strongly advised not to smoke. This medicine can make your body retain fluid, making your fingers, hands, or ankles swell. Your blood pressure can go up. Contact your doctor or health care professional if you feel you are retaining fluid. This medicine can make you more sensitive to the sun. Keep out of the sun. If you cannot avoid being in the sun, wear protective clothing and use sunscreen. Do not use sun lamps or tanning beds/booths. If you wear contact lenses and notice visual changes, or if the lenses begin to feel uncomfortable, consult your eye care specialist. In some women, tenderness, swelling, or minor bleeding of the gums may occur. Notify your dentist if this happens. Brushing and flossing your teeth regularly may help limit this. See your dentist regularly and inform your dentist  of the medicines you are taking. If you are going to have elective surgery, you may need to stop taking this medicine before the surgery. Consult your health care professional for advice. This medicine does not protect you against HIV infection (AIDS) or any other sexually transmitted diseases. What side effects may I notice from receiving this medicine? Side effects that you should report to your doctor or health care professional as soon as possible:  allergic reactions like skin rash, itching or hives, swelling of the face, lips, or tongue  breast tissue changes or discharge  changes in vaginal bleeding during your period or between your periods  changes in vision  chest pain  confusion  coughing up blood  dizziness  feeling faint or lightheaded  headaches or migraines  leg, arm or groin pain  loss of balance or coordination  severe or sudden headaches  stomach pain (severe)  sudden shortness of breath  sudden numbness or weakness of the face, arm or leg  symptoms of vaginal infection like itching, irritation or unusual discharge  tenderness in the upper abdomen  trouble speaking or understanding  vomiting  yellowing of the eyes or skin Side effects that usually do not require medical attention (report to your doctor or health care professional if they continue or are bothersome):  breakthrough bleeding and spotting that continues beyond the 3 initial cycles of pills  breast tenderness  mood changes, anxiety, depression, frustration, anger, or  emotional outbursts  increased sensitivity to sun or ultraviolet light  nausea  skin rash, acne, or brown spots on the skin  weight gain (slight) This list may not describe all possible side effects. Call your doctor for medical advice about side effects. You may report side effects to FDA at 1-800-FDA-1088. Where should I keep my medicine? Keep out of the reach of children. Store at room temperature between  15 and 30 degrees C (59 and 86 degrees F). Throw away any unused medicine after the expiration date. NOTE: This sheet is a summary. It may not cover all possible information. If you have questions about this medicine, talk to your doctor, pharmacist, or health care provider.  2020 Elsevier/Gold Standard (2015-12-28 08:04:41)

## 2020-05-05 ENCOUNTER — Telehealth: Payer: Self-pay | Admitting: Nurse Practitioner

## 2020-05-06 NOTE — Telephone Encounter (Signed)
ERROR

## 2020-10-26 ENCOUNTER — Ambulatory Visit: Payer: 59

## 2020-12-18 ENCOUNTER — Ambulatory Visit: Payer: 59

## 2020-12-21 ENCOUNTER — Ambulatory Visit: Payer: 59

## 2021-03-19 ENCOUNTER — Encounter: Payer: Self-pay | Admitting: Nurse Practitioner

## 2021-05-14 ENCOUNTER — Telehealth: Payer: Self-pay | Admitting: Nurse Practitioner

## 2021-05-14 ENCOUNTER — Encounter: Payer: 59 | Admitting: Nurse Practitioner

## 2021-05-14 NOTE — Telephone Encounter (Signed)
Pt didn't show for her app.

## 2021-05-18 ENCOUNTER — Encounter: Payer: Self-pay | Admitting: Nurse Practitioner

## 2021-05-18 NOTE — Telephone Encounter (Signed)
Pt was no show for appt 05/14/2021. 1st occurrence. Fee is waived on first occurrence.  No show letter mailed.  Next Appt: 06/25/21

## 2021-06-25 ENCOUNTER — Other Ambulatory Visit: Payer: Self-pay

## 2021-06-25 ENCOUNTER — Other Ambulatory Visit (HOSPITAL_COMMUNITY)
Admission: RE | Admit: 2021-06-25 | Discharge: 2021-06-25 | Disposition: A | Discharge status: Home / Self Care | Payer: Managed Care, Other (non HMO) | Source: Ambulatory Visit | Attending: Nurse Practitioner | Admitting: Nurse Practitioner

## 2021-06-25 ENCOUNTER — Ambulatory Visit (INDEPENDENT_AMBULATORY_CARE_PROVIDER_SITE_OTHER): Payer: Managed Care, Other (non HMO) | Admitting: Nurse Practitioner

## 2021-06-25 ENCOUNTER — Encounter: Payer: Self-pay | Admitting: Nurse Practitioner

## 2021-06-25 VITALS — BP 116/88 | HR 66 | Temp 98.0°F | Ht 62.0 in | Wt 143.6 lb

## 2021-06-25 DIAGNOSIS — Z124 Encounter for screening for malignant neoplasm of cervix: Secondary | ICD-10-CM | POA: Diagnosis present

## 2021-06-25 DIAGNOSIS — Z113 Encounter for screening for infections with a predominantly sexual mode of transmission: Secondary | ICD-10-CM

## 2021-06-25 DIAGNOSIS — Z13220 Encounter for screening for lipoid disorders: Secondary | ICD-10-CM

## 2021-06-25 DIAGNOSIS — Z136 Encounter for screening for cardiovascular disorders: Secondary | ICD-10-CM

## 2021-06-25 DIAGNOSIS — F4323 Adjustment disorder with mixed anxiety and depressed mood: Secondary | ICD-10-CM

## 2021-06-25 DIAGNOSIS — Z0001 Encounter for general adult medical examination with abnormal findings: Secondary | ICD-10-CM | POA: Diagnosis present

## 2021-06-25 DIAGNOSIS — Z1231 Encounter for screening mammogram for malignant neoplasm of breast: Secondary | ICD-10-CM

## 2021-06-25 DIAGNOSIS — Z630 Problems in relationship with spouse or partner: Secondary | ICD-10-CM | POA: Diagnosis not present

## 2021-06-25 HISTORY — DX: Problems in relationship with spouse or partner: Z63.0

## 2021-06-25 LAB — LIPID PANEL
Cholesterol: 162 mg/dL (ref 0–200)
HDL: 50 mg/dL (ref >39.00)
LDL Cholesterol: 97 mg/dL (ref 0–99)
NonHDL: 111.75
Total CHOL/HDL Ratio: 3
Triglycerides: 76 mg/dL (ref 0.0–149.0)
VLDL: 15.2 mg/dL (ref 0.0–40.0)

## 2021-06-25 LAB — COMPREHENSIVE METABOLIC PANEL
ALT: 31 U/L (ref 0–35)
AST: 38 U/L — ABNORMAL HIGH (ref 0–37)
Albumin: 4.3 g/dL (ref 3.5–5.2)
Alkaline Phosphatase: 48 U/L (ref 39–117)
BUN: 12 mg/dL (ref 6–23)
CO2: 30 mEq/L (ref 19–32)
Calcium: 8.8 mg/dL (ref 8.4–10.5)
Chloride: 106 mEq/L (ref 96–112)
Creatinine, Ser: 0.8 mg/dL (ref 0.40–1.20)
GFR: 91.51 mL/min (ref >60.00)
Glucose, Bld: 92 mg/dL (ref 70–99)
Potassium: 4.2 mEq/L (ref 3.5–5.1)
Sodium: 138 mEq/L (ref 135–145)
Total Bilirubin: 0.3 mg/dL (ref 0.2–1.2)
Total Protein: 6.7 g/dL (ref 6.0–8.3)

## 2021-06-25 LAB — CBC WITH DIFFERENTIAL/PLATELET
Basophils Absolute: 0 10*3/uL (ref 0.0–0.1)
Basophils Relative: 0.7 % (ref 0.0–3.0)
Eosinophils Absolute: 0.1 10*3/uL (ref 0.0–0.7)
Eosinophils Relative: 2.7 % (ref 0.0–5.0)
HCT: 41.3 % (ref 36.0–46.0)
Hemoglobin: 13.5 g/dL (ref 12.0–15.0)
Lymphocytes Relative: 31.8 % (ref 12.0–46.0)
Lymphs Abs: 1.4 10*3/uL (ref 0.7–4.0)
MCHC: 32.8 g/dL (ref 30.0–36.0)
MCV: 91.2 fl (ref 78.0–100.0)
Monocytes Absolute: 0.2 10*3/uL (ref 0.1–1.0)
Monocytes Relative: 4.2 % (ref 3.0–12.0)
Neutro Abs: 2.6 10*3/uL (ref 1.4–7.7)
Neutrophils Relative %: 60.6 % (ref 43.0–77.0)
Platelets: 212 10*3/uL (ref 150.0–400.0)
RBC: 4.52 Mil/uL (ref 3.87–5.11)
RDW: 14 % (ref 11.5–15.5)
WBC: 4.3 10*3/uL (ref 4.0–10.5)

## 2021-06-25 LAB — TSH: TSH: 2.05 u[IU]/mL (ref 0.35–5.50)

## 2021-06-25 NOTE — Assessment & Plan Note (Signed)
resolved 

## 2021-06-25 NOTE — Progress Notes (Signed)
Subjective:    Patient ID: Shannon Marks, female    DOB: 1981-05-01, 41 y.o.   MRN: [id number]  Patient presents today for CPE   HPI  Vision:up to date Dental:up to date Diet:regular Exercise:none Weight:  Wt Readings from Last 3 Encounters:  06/25/21 143 lb 9.6 oz (65.1 kg)  05/04/20 121 lb 12.8 oz (55.2 kg)  03/16/20 119 lb 6.4 oz (54.2 kg)    Sexual History (orientation,birth control, marital status, STD):not sexually active, wants STD screen due to husband's infidelity, request for repeat PAP. Needs mammogram.  Depression/Suicide: agreed to psychology referral due to marital discord and autistic son admitted to behavioral health unit. Depression screen [facility] 2/9 06/25/2021 03/16/2020  Decreased Interest 0 0  Down, Depressed, Hopeless 1 0  PHQ - 2 Score 1 0  Altered sleeping 1 0  Tired, decreased energy 2 3  Change in appetite 0 3  Feeling bad or failure about yourself  0 0  Trouble concentrating 1 1  Moving slowly or fidgety/restless 0 1  Suicidal thoughts 0 0  PHQ-9 Score 5 8  Difficult doing work/chores Somewhat difficult Not difficult at all   GAD 7 : Generalized Anxiety Score 06/25/2021 03/16/2020  Nervous, Anxious, on Edge 1 1  Control/stop worrying 0 1  Worry too much - different things 0 1  Trouble relaxing 1 1  Restless 1 1  Easily annoyed or irritable 0 1  Afraid - awful might happen 0 0  Total GAD 7 Score 3 6  Anxiety Difficulty Somewhat difficult Not difficult at all   Immunizations: (TDAP, Hep C screen, Pneumovax, Influenza, zoster)  Health Maintenance  Topic Date Due   COVID-19 Vaccine (3 - Booster for Janssen series) 06/29/2020   Flu Shot  11/30/2020   Pap Smear  03/17/2023   Tetanus Vaccine  03/16/2030   Hepatitis C Screening: USPSTF Recommendation to screen - Ages 18-79 yo.  Completed   HIV Screening  Completed   HPV Vaccine  Aged Out   Fall Risk: Fall Risk  05/04/2020  Falls in the past year? 0  Number falls in past yr: 0  Injury with  Fall? 0   Medications and allergies reviewed with patient and updated if appropriate.  Patient Active Problem List   Diagnosis Date Noted   Marital conflict 06/25/2021   No current outpatient medications on file prior to visit.   No current facility-administered medications on file prior to visit.   History reviewed. No pertinent past medical history.  Past Surgical History:  Procedure Laterality Date   TUBAL LIGATION     Social History   Socioeconomic History   Marital status: Single    Spouse name: Not on file   Number of children: Not on file   Years of education: Not on file   Highest education level: Not on file  Occupational History   Not on file  Tobacco Use   Smoking status: Never   Smokeless tobacco: Never  Vaping Use   Vaping Use: Never used  Substance and Sexual Activity   Alcohol use: No   Drug use: No   Sexual activity: Yes    Birth control/protection: Surgical  Other Topics Concern   Not on file  Social History Narrative   Not on file   Social Determinants of Health   Financial Resource Strain: Not on file  Food Insecurity: Not on file  Transportation Needs: Not on file  Physical Activity: Not on file  Stress: Not on file  Social Connections: Not on file   Family History  Family history unknown: Yes       Review of Systems  Constitutional:  Negative for fever, malaise/fatigue and weight loss.  HENT:  Negative for congestion and sore throat.   Eyes:        Negative for visual changes  Respiratory:  Negative for cough and shortness of breath.   Cardiovascular:  Negative for chest pain, palpitations and leg swelling.  Gastrointestinal:  Negative for blood in stool, constipation, diarrhea and heartburn.  Genitourinary:  Negative for dysuria, frequency and urgency.  Musculoskeletal:  Negative for falls, joint pain and myalgias.  Skin:  Negative for rash.  Neurological:  Negative for dizziness, sensory change and headaches.   Endo/Heme/Allergies:  Does not bruise/bleed easily.  Psychiatric/Behavioral:  Negative for depression, hallucinations, substance abuse and suicidal ideas. The patient is nervous/anxious. The patient does not have insomnia.    Objective:   Vitals:   06/25/21 1145  BP: 116/88  Pulse: 66  Temp: 98 F (36.7 C)  SpO2: 98%    Body mass index is 26.26 kg/m.   Physical Examination:  Physical Exam Constitutional:      General: She is not in acute distress. HENT:     Right Ear: Tympanic membrane, ear canal and external ear normal.     Left Ear: Tympanic membrane, ear canal and external ear normal.  Eyes:     General: No scleral icterus.    Extraocular Movements: Extraocular movements intact.     Conjunctiva/sclera: Conjunctivae normal.  Cardiovascular:     Rate and Rhythm: Normal rate and regular rhythm.     Pulses: Normal pulses.     Heart sounds: Normal heart sounds.  Pulmonary:     Effort: Pulmonary effort is normal. No respiratory distress.     Breath sounds: Normal breath sounds.  Chest:  Breasts:    Breasts are symmetrical.     Right: Normal.     Left: Normal.  Abdominal:     General: Bowel sounds are normal. There is no distension.     Palpations: Abdomen is soft.     Hernia: There is no hernia in the left inguinal area or right inguinal area.  Genitourinary:    General: Normal vulva.     Exam position: Lithotomy position.     Labia:        Right: No rash, tenderness or lesion.        Left: No rash, tenderness or lesion.      Urethra: No prolapse.     Vagina: Normal.     Cervix: Normal.     Uterus: Normal.      Adnexa: Right adnexa normal and left adnexa normal.  Musculoskeletal:        General: Normal range of motion.     Cervical back: Normal range of motion and neck supple.     Right lower leg: No edema.     Left lower leg: No edema.  Lymphadenopathy:     Cervical: No cervical adenopathy.     Upper Body:     Right upper body: No supraclavicular,  axillary or pectoral adenopathy.     Left upper body: No supraclavicular, axillary or pectoral adenopathy.     Lower Body: No right inguinal adenopathy. No left inguinal adenopathy.  Skin:    General: Skin is warm and dry.  Neurological:     Mental Status: She is alert and oriented to person, place, and time.  Psychiatric:  Mood and Affect: Mood normal.        Behavior: Behavior normal.        Thought Content: Thought content normal.   ASSESSMENT and PLAN: This visit occurred during the SARS-CoV-2 public health emergency.  Safety protocols were in place, including screening questions prior to the visit, additional usage of staff PPE, and extensive cleaning of exam room while observing appropriate contact time as indicated for disinfecting solutions.   Shannon Marks was seen today for annual exam.  Diagnoses and all orders for this visit:  Encounter for preventative adult health care exam with abnormal findings -     CBC with Differential/Platelet -     Comprehensive metabolic panel -     Lipid panel -     Cancel: Cytology - PAP( [place]) -     MM 3D SCREEN BREAST BILATERAL -     Cytology - PAP( [place])  Screen for STD (sexually transmitted disease) -     Cancel: Cervicovaginal ancillary only( [place]) -     HIV antibody (with reflex) -     RPR -     Hepatitis C Antibody -     HSV(herpes simplex vrs) 1+2 ab-IgG -     Hepatitis B Core Antibody, IgM -     Hepatitis B Surface AntiGEN -     Cervicovaginal ancillary only( [place])  Marital conflict -     Ambulatory referral to Psychology  Encounter for lipid screening for cardiovascular disease -     Lipid panel  Encounter for Papanicolaou smear for cervical cancer screening -     Cancel: Cytology - PAP( [place]) -     Cytology - PAP( [place])  Breast cancer screening by mammogram -     MM 3D SCREEN BREAST BILATERAL  Adjustment disorder with mixed anxiety and depressed mood -     TSH -      Ambulatory referral to Psychology      Problem List Items Addressed This Visit       Other   Marital conflict   Relevant Orders   Ambulatory referral to Psychology   Other Visit Diagnoses     Encounter for preventative adult health care exam with abnormal findings    -  Primary   Relevant Orders   CBC with Differential/Platelet   Comprehensive metabolic panel   Lipid panel   MM 3D SCREEN BREAST BILATERAL   Cytology - PAP( [place])   Screen for STD (sexually transmitted disease)       Relevant Orders   HIV antibody (with reflex)   RPR   Hepatitis C Antibody   HSV(herpes simplex vrs) 1+2 ab-IgG   Hepatitis B Core Antibody, IgM   Hepatitis B Surface AntiGEN   Cervicovaginal ancillary only( [place])   Encounter for lipid screening for cardiovascular disease       Relevant Orders   Lipid panel   Encounter for Papanicolaou smear for cervical cancer screening       Relevant Orders   Cytology - PAP( )   Breast cancer screening by mammogram       Relevant Orders   MM 3D SCREEN BREAST BILATERAL   Adjustment disorder with mixed anxiety and depressed mood       Relevant Orders   TSH   Ambulatory referral to Psychology       Follow up: Return in about 2 weeks (around 07/09/2021) for dysmenorrhea and acne.  Alysia Penna, NP

## 2021-06-25 NOTE — Patient Instructions (Signed)
Go to lab for blood draw  You will be contacted to schedule appt with psychologist and for mammogram.  Preventive Care 34-42 Years Old, Female Preventive care refers to lifestyle choices and visits with your health care provider that can promote health and wellness. Preventive care visits are also called wellness exams. What can I expect for my preventive care visit? Counseling Your health care provider may ask you questions about your: Medical history, including: Past medical problems. Family medical history. Pregnancy history. Current health, including: Menstrual cycle. Method of birth control. Emotional well-being. Home life and relationship well-being. Sexual activity and sexual health. Lifestyle, including: Alcohol, nicotine or tobacco, and drug use. Access to firearms. Diet, exercise, and sleep habits. Work and work Statistician. Sunscreen use. Safety issues such as seatbelt and bike helmet use. Physical exam Your health care provider will check your: Height and weight. These may be used to calculate your BMI (body mass index). BMI is a measurement that tells if you are at a healthy weight. Waist circumference. This measures the distance around your waistline. This measurement also tells if you are at a healthy weight and may help predict your risk of certain diseases, such as type 2 diabetes and high blood pressure. Heart rate and blood pressure. Body temperature. Skin for abnormal spots. What immunizations do I need? Vaccines are usually given at various ages, according to a schedule. Your health care provider will recommend vaccines for you based on your age, medical history, and lifestyle or other factors, such as travel or where you work. What tests do I need? Screening Your health care provider may recommend screening tests for certain conditions. This may include: Lipid and cholesterol levels. Diabetes screening. This is done by checking your blood sugar (glucose)  after you have not eaten for a while (fasting). Pelvic exam and Pap test. Hepatitis B test. Hepatitis C test. HIV (human immunodeficiency virus) test. STI (sexually transmitted infection) testing, if you are at risk. Lung cancer screening. Colorectal cancer screening. Mammogram. Talk with your health care provider about when you should start having regular mammograms. This may depend on whether you have a family history of breast cancer. BRCA-related cancer screening. This may be done if you have a family history of breast, ovarian, tubal, or peritoneal cancers. Bone density scan. This is done to screen for osteoporosis. Talk with your health care provider about your test results, treatment options, and if necessary, the need for more tests. Follow these instructions at home: Eating and drinking  Eat a diet that includes fresh fruits and vegetables, whole grains, lean protein, and low-fat dairy products. Take vitamin and mineral supplements as recommended by your health care provider. Do not drink alcohol if: Your health care provider tells you not to drink. You are pregnant, may be pregnant, or are planning to become pregnant. If you drink alcohol: Limit how much you have to 0-1 drink a day. Know how much alcohol is in your drink. In the [place] one drink equals one 12 oz bottle of beer (355 mL), one 5 oz glass of wine (148 mL), or one 1 oz glass of hard liquor (44 mL). Lifestyle Brush your teeth every morning and night with fluoride toothpaste. Floss one time each day. Exercise for at least 30 minutes 5 or more days each week. Do not use any products that contain nicotine or tobacco. These products include cigarettes, chewing tobacco, and vaping devices, such as e-cigarettes. If you need help quitting, ask your health care provider. Do not  use drugs. If you are sexually active, practice safe sex. Use a condom or other form of protection to prevent STIs. If you do not wish to become  pregnant, use a form of birth control. If you plan to become pregnant, see your health care provider for a prepregnancy visit. Take aspirin only as told by your health care provider. Make sure that you understand how much to take and what form to take. Work with your health care provider to find out whether it is safe and beneficial for you to take aspirin daily. Find healthy ways to manage stress, such as: Meditation, yoga, or listening to music. Journaling. Talking to a trusted person. Spending time with friends and family. Minimize exposure to UV radiation to reduce your risk of skin cancer. Safety Always wear your seat belt while driving or riding in a vehicle. Do not drive: If you have been drinking alcohol. Do not ride with someone who has been drinking. When you are tired or distracted. While texting. If you have been using any mind-altering substances or drugs. Wear a helmet and other protective equipment during sports activities. If you have firearms in your house, make sure you follow all gun safety procedures. Seek help if you have been physically or sexually abused. What's next? Visit your health care provider once a year for an annual wellness visit. Ask your health care provider how often you should have your eyes and teeth checked. Stay up to date on all vaccines. This information is not intended to replace advice given to you by your health care provider. Make sure you discuss any questions you have with your health care provider. Document Revised: 10/14/2020 Document Reviewed: 10/14/2020 Elsevier Patient Education  [place].

## 2021-06-28 LAB — CERVICOVAGINAL ANCILLARY ONLY
Chlamydia: NEGATIVE
Comment: NEGATIVE
Comment: NEGATIVE
Comment: NORMAL
Neisseria Gonorrhea: NEGATIVE
Trichomonas: NEGATIVE

## 2021-06-28 LAB — HSV(HERPES SIMPLEX VRS) I + II AB-IGG
HAV 1 IGG,TYPE SPECIFIC AB: >58 index — ABNORMAL HIGH
HSV 2 IGG,TYPE SPECIFIC AB: <0.9 index

## 2021-06-28 LAB — RPR: RPR Ser Ql: NONREACTIVE

## 2021-06-28 LAB — HEPATITIS C ANTIBODY
Hepatitis C Ab: NONREACTIVE
SIGNAL TO CUT-OFF: 0.02 (ref <1.00)

## 2021-06-28 LAB — HEPATITIS B CORE ANTIBODY, IGM: Hep B C IgM: NONREACTIVE

## 2021-06-28 LAB — HEPATITIS B SURFACE ANTIGEN: Hepatitis B Surface Ag: NONREACTIVE

## 2021-06-28 LAB — HIV ANTIBODY (ROUTINE TESTING W REFLEX): HIV 1&2 Ab, 4th Generation: NONREACTIVE

## 2021-06-30 LAB — CYTOLOGY - PAP
Comment: NEGATIVE
Diagnosis: UNDETERMINED — AB
High risk HPV: NEGATIVE

## 2021-07-02 ENCOUNTER — Ambulatory Visit: Payer: Managed Care, Other (non HMO) | Admitting: Nurse Practitioner

## 2021-07-09 ENCOUNTER — Ambulatory Visit (INDEPENDENT_AMBULATORY_CARE_PROVIDER_SITE_OTHER): Payer: Managed Care, Other (non HMO) | Admitting: Nurse Practitioner

## 2021-07-09 ENCOUNTER — Encounter: Payer: Self-pay | Admitting: Nurse Practitioner

## 2021-07-09 ENCOUNTER — Telehealth: Payer: Self-pay | Admitting: Nurse Practitioner

## 2021-07-09 ENCOUNTER — Other Ambulatory Visit: Payer: Self-pay

## 2021-07-09 VITALS — BP 100/60 | HR 78 | Temp 98.0°F | Ht 62.0 in | Wt 148.0 lb

## 2021-07-09 DIAGNOSIS — N946 Dysmenorrhea, unspecified: Secondary | ICD-10-CM

## 2021-07-09 DIAGNOSIS — N920 Excessive and frequent menstruation with regular cycle: Secondary | ICD-10-CM | POA: Insufficient documentation

## 2021-07-09 DIAGNOSIS — L700 Acne vulgaris: Secondary | ICD-10-CM | POA: Insufficient documentation

## 2021-07-09 DIAGNOSIS — Z3009 Encounter for other general counseling and advice on contraception: Secondary | ICD-10-CM

## 2021-07-09 MED ORDER — DROSPIRENONE-ETHINYL ESTRADIOL 3-0.02 MG PO TABS: 1.0000 | ORAL_TABLET | Freq: Every day | ORAL | 11 refills | Status: DC

## 2021-07-09 MED ORDER — CLINDAMYCIN PHOS-BENZOYL PEROX 1-5 % EX GEL: Freq: Two times a day (BID) | CUTANEOUS | 1 refills | Status: DC

## 2021-07-09 NOTE — Assessment & Plan Note (Addendum)
Chronic, worse in last 1year. Located on chin and neck, painful when imflammed ?Denies use of make up ?No improvement while taking COC ?No improvement with OTC benzoly preparation cream. ? ?Sent benzaclin ?Clean skin with salicylic acid acne wash once daily x 1week, then take a break for 1week, then repeat. ?Use facial moisturizer with sunscreen. ?

## 2021-07-09 NOTE — Telephone Encounter (Signed)
Pt called stated her meds that were sent in are going to cost 144.00 and she can not afford this right now. Is there anything else or generic meds to replace? The two on her chart. ?

## 2021-07-09 NOTE — Assessment & Plan Note (Addendum)
Onset 1year ago, worsening, Menstrual cycle every 28-30days, vaginal bleeding x 7days, also has clots-size of quarter. ?No improvement with use of junel-fe COC x 1year and ibuprofen 600mg  every 6hrs ?Report hx of dyspareunia, not sexually active for several months. ?Recent STD screen and pelvic exam were normal ?Normal CBC, CMP and TSH 2weeks ago. ?unknown FHx ? ?Check pelvic US ?Start Yaz COC ?

## 2021-07-09 NOTE — Patient Instructions (Addendum)
Clean skin with salicylic acid acne wash once daily x 1week, then take a break for 1week, then repeat. ?Use facial moisturizer with sunscreen. ? ?You will be contacted to schedule appt for pelvic US. ? ?Dysmenorrhea ?Dysmenorrhea means cramps during your period (menstrual period) that cause pain in your lower belly (abdomen). The pain is caused by the tightening (contracting) of the muscles of the womb (uterus). The pain may be mild or very bad. ?Primary dysmenorrhea is cramps that last a couple of days when a woman starts having periods or soon after. As a woman gets older or has a baby, the cramps will usually lessen or disappear. Secondary dysmenorrhea begins later in life and is caused by other problems. ?What are the causes? ?This condition may be caused by problems with the: ?Tissue that lines the womb. This tissue may grow: ?Outside of the womb. ?Into the walls of the womb. ?Blood vessels in the area between your hip bones (pelvis). ?Tissue in the lower part of the womb (cervix), including growths (polyps). ?Muscles that hold up the womb. ?Bladder. ?Bowels. ?It can also be caused by cancer. ?Other causes include: ?A very tipped womb. ?The lower part of the womb having a small opening. ?Tumors in the womb that are not cancer. ?Pelvic inflammatory disease (PID). ?Scars from surgeries you have had. ?A cyst in the ovaries. ?An IUD (intrauterine device). ?What increases the risk? ?Being younger than age 41. ?Having started puberty early. ?Having irregular bleeding or heavy bleeding. ?Never having given birth. ?Having a family history of period cramps. ?Smoking or using products with nicotine. ?Having a high body weight or a low body weight. ?What are the signs or symptoms? ?Cramps and pain in the lower belly or lower back. ?A feeling of fullness in the lower belly. ?Periods lasting for longer than 7 days. ?Headaches. ?Bloating. ?Tiredness (fatigue). ?Feeling like you may vomit (nauseous) or vomiting. ?Watery poop  (diarrhea) or loose poop (stool). ?Sweating. ?Dizziness. ?How is this treated? ?Treatment depends on the cause of the cramps. Treatment may include medicines, such as: ?Medicines for pain. ?Medicines for bleeding. ?Body chemical (hormone) replacement therapy. ?Shots (injections) to stop the menstrual period. ?Birth control pills. ?An IUD. ?NSAIDs, such as ibuprofen. ?Other treatments may include: ?Surgeries. ?Procedures. ?Nerve stimulation. ?Doing exercises. ?Yoga and alternative treatments. ?Work with your doctor to find what treatments are best for you. ?Follow these instructions at home: ?Helping pain and cramping ? ?If told, put heat on your lower back or belly when you have pain or cramps. Do this as often as told by your doctor. Use the heat source that your doctor recommends, such as a moist heat pack or a heating pad. ?Place a towel between your skin and the heat. ?Leave the heat on for 20-30 minutes. ?Take off the heat if your skin turns bright red. This is very important. If you cannot feel pain, heat, or cold, you have a greater risk of getting burned. ?Do not sleep with a heating pad. ?Exercise. Walking, swimming, or biking can help take away cramps. ?Massage your lower back or belly. This may help lessen pain. ?General instructions ?Take over-the-counter and prescription medicines only as told by your doctor. ?Ask your doctor if you should avoid driving or using machines while you are taking your medicine. ?Avoid alcohol and caffeine during and right before your period. These can make cramps worse. ?Do not smoke or use any products that contain nicotine or tobacco. If you need help quitting, ask your doctor. ?Keep all  follow-up visits. ?Contact a doctor if: ?You have pain that gets worse. ?You have pain that does not get better with medicine. ?You have pain during sex. ?You feel like you may vomit or you vomit during your period and medicine does not help. ?Get help right away if: ?You  faint. ?Summary ?Dysmenorrhea means painful cramps during your period. ?Put heat on your lower back or belly when you have pain or cramps. ?Do exercises like walking, swimming, or biking to help with cramps. ?Contact a doctor if you have pain during sex. ?This information is not intended to replace advice given to you by your health care provider. Make sure you discuss any questions you have with your health care provider. ?Document Revised: 12/04/2019 Document Reviewed: 12/04/2019 ?Elsevier Patient Education ? 2022 Elsevier Inc. ? ?

## 2021-07-09 NOTE — Progress Notes (Signed)
? ?Subjective:  ?Patient ID: Shannon Marks, female    DOB: August 06, 1980  Age: 41 y.o. MRN: [id number] ? ?CC: Follow-up (2 week f/u on acne and dysmenorrhea. /Pt states there has been no improvement in either. ) ? ? ?HPI ? ?Dysmenorrhea ?Onset 1year ago, worsening, Menstrual cycle every 28-30days, vaginal bleeding x 7days, also has clots-size of quarter. ?No improvement with use of junel-fe COC x 1year and ibuprofen 600mg  every 6hrs ?Report hx of dyspareunia, not sexually active for several months. ?Recent STD screen and pelvic exam were normal ?Normal CBC, CMP and TSH 2weeks ago. ?unknown FHx ? ?Check pelvic US ?Start Yaz COC ? 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 Located on chin and neck, painful when imflammed ?Denies use of make up ?No improvement while taking COC ?No improvement with OTC benzoly preparation cream. ? ?Sent benzaclin ?Clean skin with salicylic acid acne wash once daily x 1week, then take a break for 1week, then repeat. ?Use facial moisturizer with sunscreen. ? ?No outpatient medications prior to visit.  ? ?No facility-administered medications prior to visit.  ? ?ROS ?See HPI ? ?Objective:  ?BP 100/60 (BP Location: Left Arm, Patient Position: Sitting, Cuff Size: Normal)   Pulse 78   Temp 98 ?F (36.7 ?C) (Temporal)   Ht 5\' 2"  (1.575 m)   Wt 148 lb (67.1 kg)   LMP 06/19/2021 (Exact Date)   BMI 27.07 kg/m?  ? ?Physical Exam ?Vitals reviewed.  ?Constitutional:   ?   Appearance: She is well-developed.  ?Skin: ?   General: Skin is warm and dry.  ?   Findings: Acne present.  ? ?    ?Neurological:  ?   Mental Status: She is alert and oriented to person, place, and time.  ?   Deep Tendon Reflexes: Reflexes are normal and symmetric.  ? ? ?Assessment & Plan:  ?This visit occurred during the SARS-CoV-2 public health emergency.  Safety protocols were in place, including screening questions prior to the visit, additional usage of staff PPE, and extensive cleaning of exam room while observing  appropriate contact time as indicated for disinfecting solutions.  ? ?Shannon Marks was seen today for follow-up. ? ?Diagnoses and all orders for this visit: ? ?Acne cystica ?-     clindamycin-benzoyl peroxide (BENZACLIN) gel; Apply topically 2 (two) times daily. ? ?Dysmenorrhea ?-     drospirenone-ethinyl estradiol (YAZ) 3-0.02 MG tablet; Take 1 tablet by mouth daily. ?-     US PELVIC COMPLETE WITH TRANSVAGINAL; Future ? ?Encounter for counseling regarding contraception ? ? ?Problem List Items Addressed This Visit   ? ?  ? Musculoskeletal and Integument  ? Acne cystica - Primary  ?  Chronic, worse in last 1year. Located on chin and neck, painful when imflammed ?Denies use of make up ?No improvement while taking COC ?No improvement with OTC benzoly preparation cream. ? ?Sent benzaclin ?Clean skin with salicylic acid acne wash once daily x 1week, then take a break for 1week, then repeat. ?Use facial moisturizer with sunscreen. ?  ?  ? Relevant Medications  ? drospirenone-ethinyl estradiol (YAZ) 3-0.02 MG tablet  ? clindamycin-benzoyl peroxide (BENZACLIN) gel  ?  ? Genitourinary  ? Dysmenorrhea  ?  Onset 1year ago, worsening, Menstrual cycle every 28-30days, vaginal bleeding x 7days, also has clots-size of quarter. ?No improvement with use of junel-fe COC x 1year and ibuprofen 600mg  every 6hrs ?Report hx of dyspareunia, not sexually active for several months. ?Recent STD screen and pelvic exam were normal ?Normal CBC, CMP  and TSH 2weeks ago. ?unknown FHx ? ?Check pelvic US ?Start Yaz COC ?  ?  ? Relevant Medications  ? drospirenone-ethinyl estradiol (YAZ) 3-0.02 MG tablet  ? Other Relevant Orders  ? US PELVIC COMPLETE WITH TRANSVAGINAL  ?  ? Other  ? Encounter for counseling regarding contraception  ?  ?Follow-up: Return if symptoms worsen or fail to improve. ? ?Wilfred Lacy, NP ?

## 2021-07-12 NOTE — Telephone Encounter (Signed)
Informed patient provider out of the office today but once she reviews the request we will contact her with further instructions. Pt state it is the face wash that is costly, she states it is covered by her insurance but her portion to pay is $80 and that is to expensive for her. Please advise.  ?

## 2021-07-13 NOTE — Telephone Encounter (Signed)
Informed patient to reach out to her insurance and find a Rx that will not be as costly.  ?

## 2021-07-30 ENCOUNTER — Ambulatory Visit
Admission: RE | Admit: 2021-07-30 | Discharge: 2021-07-30 | Disposition: A | Discharge status: Home / Self Care | Payer: Managed Care, Other (non HMO) | Source: Ambulatory Visit | Attending: Nurse Practitioner | Admitting: Nurse Practitioner

## 2021-07-30 DIAGNOSIS — N946 Dysmenorrhea, unspecified: Secondary | ICD-10-CM

## 2021-08-27 ENCOUNTER — Encounter: Payer: Self-pay | Admitting: Nurse Practitioner

## 2021-08-27 ENCOUNTER — Ambulatory Visit (INDEPENDENT_AMBULATORY_CARE_PROVIDER_SITE_OTHER): Payer: Managed Care, Other (non HMO) | Admitting: Nurse Practitioner

## 2021-08-27 VITALS — BP 100/60 | HR 84 | Temp 97.5°F | Ht 62.0 in | Wt 147.4 lb

## 2021-08-27 DIAGNOSIS — J069 Acute upper respiratory infection, unspecified: Secondary | ICD-10-CM

## 2021-08-27 LAB — POCT RAPID STREP A (OFFICE): Rapid Strep A Screen: NEGATIVE

## 2021-08-27 LAB — POC COVID19 BINAXNOW: SARS Coronavirus 2 Ag: NEGATIVE

## 2021-08-27 MED ORDER — HYDROCODONE BIT-HOMATROP MBR 5-1.5 MG/5ML PO SOLN: 5.0000 mL | Freq: Three times a day (TID) | ORAL | 0 refills | Status: DC | PRN

## 2021-08-27 MED ORDER — FLUTICASONE PROPIONATE 50 MCG/ACT NA SUSP: 2.0000 | Freq: Every day | NASAL | 0 refills | Status: DC

## 2021-08-27 MED ORDER — GUAIFENESIN ER 600 MG PO TB12: 600.0000 mg | ORAL_TABLET | Freq: Two times a day (BID) | ORAL | 0 refills | Status: DC | PRN

## 2021-08-27 MED ORDER — ALBUTEROL SULFATE HFA 108 (90 BASE) MCG/ACT IN AERS: 2.0000 | INHALATION_SPRAY | Freq: Four times a day (QID) | RESPIRATORY_TRACT | 0 refills | Status: DC | PRN

## 2021-08-27 MED ORDER — OXYMETAZOLINE HCL 0.05 % NA SOLN: 1.0000 | Freq: Two times a day (BID) | NASAL | 0 refills | Status: DC

## 2021-08-27 NOTE — Patient Instructions (Signed)
URI Instructions: ?Flonase and Afrin use: apply 1spray of afrin in each nare, wait , then apply 2sprays of flonase in each nare. Use both nasal spray consecutively x 3days, then flonase only for at least 14days. ? ?Encourage adequate oral hydration. ? ?Use over-the-counter  "cold" medicines  such as "Tylenol cold" , "Advil cold",  "Mucinex" or" Mucinex D"  for cough and congestion.  ?Avoid decongestants if you have high blood pressure. ?Use" Delsym" or" Robitussin" cough syrup varietis for cough.  ?You can use plain "Tylenol" or "Advi"l for fever, chills and achyness. ? ? "Common cold" symptoms are usually triggered by a virus.  The antibiotics are usually not necessary. On average, a" viral cold" illness can take up to 10 days to resolve. Please, make an appointment if you are not better or if you're worse.  ? ?Call office if no improvement by Monday. ?

## 2021-08-27 NOTE — Addendum Note (Signed)
Addended by: Michaela Corner on: 08/27/2021 01:49 PM ? ? Modules accepted: Orders ? ?

## 2021-08-27 NOTE — Progress Notes (Signed)
? ?             Established Patient Visit ? ?Patient: Shannon Marks   DOB: 04/22/1981   41 y.o. Female  MRN: [id number] ?Visit Date: 08/27/2021 ? ?Subjective:  ?  ?Chief Complaint  ?Patient presents with  ? Acute Visit  ?  Pt c/o sore throat and right ear pain,  states she thinks she may have strep. Symptoms have been ongoing since Monday.   ? ?URI  ?This is a new problem. The current episode started in the past 7 days. The problem has been unchanged. The maximum temperature recorded prior to her arrival was 100.4 - 100.9 F. The fever has been present for Less than 1 day. Associated symptoms include congestion, coughing, ear pain, headaches, joint pain, rhinorrhea, sinus pain and a sore throat. Pertinent negatives include no abdominal pain, chest pain, diarrhea, dysuria, joint swelling, nausea, neck pain, plugged ear sensation, rash, sneezing, swollen glands, vomiting or wheezing. She has tried acetaminophen, decongestant and increased fluids for the symptoms. The treatment provided no relief.  ? ?Reviewed medical, surgical, and social history today ? ?Medications: ?Outpatient Medications Prior to Visit  ?Medication Sig  ? drospirenone-ethinyl estradiol (YAZ) 3-0.02 MG tablet Take 1 tablet by mouth daily.  ? clindamycin-benzoyl peroxide (BENZACLIN) gel Apply topically 2 (two) times daily. (Patient not taking: Reported on 08/27/2021)  ? ?No facility-administered medications prior to visit.  ? ?Reviewed past medical and social history.  ? ?ROS per HPI above ? ? ?   ?Objective:  ?BP 100/60 (BP Location: Left Arm, Patient Position: Sitting, Cuff Size: Normal)   Pulse 84   Temp (!) 97.5 ?F (36.4 ?C) (Temporal)   Ht 5\' 2"  (1.575 m)   Wt 147 lb 6.4 oz (66.9 kg)   SpO2 98%   BMI 26.96 kg/m?  ? ?  ? ?Physical Exam ?Vitals reviewed.  ?HENT:  ?   Right Ear: Tympanic membrane, ear canal and external ear normal.  ?   Left Ear: Tympanic membrane, ear canal and external ear normal.  ?   Mouth/Throat:  ?   Pharynx:  Posterior oropharyngeal erythema present. No pharyngeal swelling, oropharyngeal exudate or uvula swelling.  ?   Tonsils: No tonsillar exudate.  ?Cardiovascular:  ?   Rate and Rhythm: Normal rate and regular rhythm.  ?   Pulses: Normal pulses.  ?   Heart sounds: Normal heart sounds.  ?Pulmonary:  ?   Effort: Pulmonary effort is normal.  ?   Breath sounds: Normal breath sounds.  ?Musculoskeletal:  ?   Cervical back: Normal range of motion and neck supple.  ?   Right lower leg: No edema.  ?   Left lower leg: No edema.  ?Lymphadenopathy:  ?   Cervical: No cervical adenopathy.  ?Neurological:  ?   Mental Status: She is alert and oriented to person, place, and time.  ?  ?No results found for any visits on 08/27/21. ?   ?Assessment & Plan:  ?  ?Problem List Items Addressed This Visit   ?None ?Visit Diagnoses   ? ? Viral upper respiratory tract infection    -  Primary  ? Relevant Medications  ? albuterol (VENTOLIN HFA) 108 (90 Base) MCG/ACT inhaler  ? HYDROcodone bit-homatropine (HYCODAN) 5-1.5 MG/5ML syrup  ? fluticasone (FLONASE) 50 MCG/ACT nasal spray  ? oxymetazoline (AFRIN NASAL SPRAY) 0.05 % nasal spray  ? guaiFENesin (MUCINEX) 600 MG 12 hr tablet  ? ?  ?Flonase and Afrin use: apply 1spray of afrin  in each nare, wait , then apply 2sprays of flonase in each nare. Use both nasal spray consecutively x 3days, then flonase only for at least 14days. ?Encourage adequate oral hydration. ?Use over-the-counter  "cold" medicines  such as "Tylenol cold" , "Advil cold",  "Mucinex" or" Mucinex D"  for cough and congestion.  ?Avoid decongestants if you have high blood pressure. ?Use" Delsym" or" Robitussin" cough syrup varietis for cough.  ?You can use plain "Tylenol" or "Advi"l for fever, chills and achyness. ? "Common cold" symptoms are usually triggered by a virus.  The antibiotics are usually not necessary. On average, a" viral cold" illness can take up to 10 days to resolve. Please, make an appointment if you are not  better or if you're worse.  ?Call office if no improvement by Monday. ? ?Return if symptoms worsen or fail to improve. ? ?  ? ?Alysia Penna, NP ? ? ?

## 2021-08-30 ENCOUNTER — Encounter: Payer: Self-pay | Admitting: Nurse Practitioner

## 2021-08-30 MED ORDER — AMOXICILLIN 500 MG PO TABS
500.0000 mg | ORAL_TABLET | Freq: Three times a day (TID) | ORAL | 0 refills | Status: AC
Start: 2021-08-30 — End: 2021-09-06

## 2021-08-30 NOTE — Telephone Encounter (Signed)
Pt called to follow up on this and asked to let her know when something gets sent in, please advise ?

## 2021-09-08 NOTE — Telephone Encounter (Signed)
FYI: Pt called to make an appointment for Friday (the only day she has off of work). Pt is having some sort of reaction on her private area ... dry, chapped, peeling, itching, burning. ? ? ? ? ?

## 2021-09-10 ENCOUNTER — Ambulatory Visit (INDEPENDENT_AMBULATORY_CARE_PROVIDER_SITE_OTHER): Payer: Managed Care, Other (non HMO) | Admitting: Nurse Practitioner

## 2021-09-10 ENCOUNTER — Encounter: Payer: Self-pay | Admitting: Nurse Practitioner

## 2021-09-10 ENCOUNTER — Other Ambulatory Visit (HOSPITAL_COMMUNITY)
Admission: RE | Admit: 2021-09-10 | Discharge: 2021-09-10 | Disposition: A | Discharge status: Home / Self Care | Payer: Managed Care, Other (non HMO) | Source: Ambulatory Visit | Attending: Nurse Practitioner | Admitting: Nurse Practitioner

## 2021-09-10 VITALS — BP 116/74 | HR 97 | Temp 97.6°F | Wt 146.6 lb

## 2021-09-10 DIAGNOSIS — N760 Acute vaginitis: Secondary | ICD-10-CM

## 2021-09-10 MED ORDER — METRONIDAZOLE 0.75 % VA GEL: 1.0000 | Freq: Every day | VAGINAL | 0 refills | Status: DC

## 2021-09-10 NOTE — Progress Notes (Signed)
? ?             Established Patient Visit ? ?Patient: Shannon Marks   DOB: 28-Nov-1980   41 y.o. Female  MRN: [id number] ?Visit Date: 09/10/2021 ? ?Subjective:  ?  ?Chief Complaint  ?Patient presents with  ? Vaginal pain  ?  Patient states she noticed dryness, itchiness, burning and pain in her vaginal area x 1 week. Has been treating with tylenol with no relief.   ? ?Vaginal Itching ?The patient's primary symptoms include genital itching and vaginal discharge. The patient's pertinent negatives include no genital lesions, genital rash, missed menses, pelvic pain or vaginal bleeding. This is a new problem. The current episode started in the past 7 days. The problem occurs constantly. The problem has been unchanged. She is not pregnant. Pertinent negatives include no abdominal pain, back pain, chills, diarrhea, dysuria, fever, frequency, joint pain, painful intercourse, rash, sore throat or urgency. The vaginal discharge was thin and mucopurulent. There has been no bleeding. She has not been passing clots. She has not been passing tissue. The symptoms are aggravated by intercourse. She has tried nothing for the symptoms. She is sexually active. It is unknown whether or not her partner has an STD. She uses tubal ligation for contraception. Her menstrual history has been regular. Her past medical history is significant for vaginosis. There is no history of PID or an STD.  ?LMP 09/02/2021 ? ?Reviewed medical, surgical, and social history today ? ?Medications: ?Outpatient Medications Prior to Visit  ?Medication Sig  ? albuterol (VENTOLIN HFA) 108 (90 Base) MCG/ACT inhaler Inhale 2 puffs into the lungs every 6 (six) hours as needed for wheezing or shortness of breath.  ? drospirenone-ethinyl estradiol (YAZ) 3-0.02 MG tablet Take 1 tablet by mouth daily.  ? fluticasone (FLONASE) 50 MCG/ACT nasal spray Place 2 sprays into both nostrils daily.  ? guaiFENesin (MUCINEX) 600 MG 12 hr tablet Take 1 tablet (600 mg total) by mouth  2 (two) times daily as needed for cough or to loosen phlegm.  ? HYDROcodone bit-homatropine (HYCODAN) 5-1.5 MG/5ML syrup Take 5 mLs by mouth every 8 (eight) hours as needed for cough.  ? oxymetazoline (AFRIN NASAL SPRAY) 0.05 % nasal spray Place 1 spray into both nostrils 2 (two) times daily.  ? clindamycin-benzoyl peroxide (BENZACLIN) gel Apply topically 2 (two) times daily. (Patient not taking: Reported on 08/27/2021)  ? ?No facility-administered medications prior to visit.  ? ?Reviewed past medical and social history.  ? ?ROS per HPI above ? ? ?   ?Objective:  ?BP 116/74 (BP Location: Left Arm, Patient Position: Sitting, Cuff Size: Large)   Pulse 97   Temp 97.6 ?F (36.4 ?C) (Temporal)   Wt 146 lb 9.6 oz (66.5 kg)   LMP 09/02/2021   SpO2 98%   BMI 26.81 kg/m?  ? ?  ? ?Physical Exam ?Exam conducted with a chaperone present.  ?Constitutional:   ?   General: She is not in acute distress. ?Genitourinary: ?   Exam position: Lithotomy position.  ?   Labia:     ?   Right: Tenderness present.     ?   Left: Tenderness present.   ?   Vagina: Vaginal discharge and erythema present.  ?   Cervix: Cervical motion tenderness, discharge and friability present.  ?Neurological:  ?   Mental Status: She is alert and oriented to person, place, and time.  ?  ?No results found for any visits on 09/10/21. ?   ?Assessment &  Plan:  ?  ?Problem List Items Addressed This Visit   ?None ?Visit Diagnoses   ? ? Acute vaginitis    -  Primary  ? Relevant Medications  ? metroNIDAZOLE (METROGEL VAGINAL) 0.75 % vaginal gel  ? Other Relevant Orders  ? Cervicovaginal ancillary only( [place])  ? ?  ? ?Return if symptoms worsen or fail to improve. ? ?  ? ?Alysia Penna, NP ? ? ?

## 2021-09-10 NOTE — Patient Instructions (Signed)
Vaginitis  Vaginitis is irritation and swelling of the vagina. Treatment will depend on the cause. What are the causes? It can be caused by: Bacteria. Yeast. A parasite. A virus. Low hormone levels. Bubble baths, scented tampons, and feminine sprays. Other things can change the balance of the yeast and bacteria that live in the vagina. These include: Antibiotic medicines. Not being clean enough. Some birth control methods. Sex. Infection. Diabetes. A weakened body defense system (immune system). What increases the risk? Smoking or being around someone who smokes. Using washes (douches), scented tampons, or scented pads. Wearing tight pants or thong underwear. Using birth control pills or an IUD. Having sex without a condom or having a lot of partners. Having an STI. Using a certain product to kill sperm (nonoxynol-9). Eating foods that are high in sugar. Having diabetes. Having low levels of a female hormone. Having a weakened body defense system. Being pregnant or breastfeeding. What are the signs or symptoms? Fluid coming from the vagina that is not normal. A bad smell. Itching, pain, or swelling. Pain with sex. Pain or burning when you pee (urinate). Sometimes there are no symptoms. How is this treated? Treatment may include: Antibiotic creams or pills. Antifungal medicines. Medicines to ease symptoms if you have a virus. Your sex partner should also be treated. Estrogen medicines. Avoiding scented soaps, sprays, or douches. Stopping use of products that caused irritation and then using a cream to treat symptoms. Follow these instructions at home: Lifestyle Keep the area around your vagina clean and dry. Avoid using soap. Rinse the area with water. Until your doctor says it is okay: Do not use washes for the vagina. Do not use tampons. Do not have sex. Wipe from front to back after going to the bathroom. When your doctor says it is okay, practice safe sex  and use condoms. General instructions Take over-the-counter and prescription medicines only as told by your doctor. If you were prescribed an antibiotic medicine, take or use it as told by your doctor. Do not stop taking or using it even if you start to feel better. Keep all follow-up visits. How is this prevented? Do not use things that can irritate the vagina, such as fabric softeners. Avoid these products if they are scented: Sprays. Detergents. Tampons. Products for cleaning the vagina. Soaps or bubble baths. Let air reach your vagina. To do this: Wear cotton underwear. Do not wear: Underwear while you sleep. Tight pants. Thong underwear. Underwear or nylons without a cotton panel. Take off any wet clothing, such as bathing suits, as soon as you can. Practice safe sex and use condoms. Contact a doctor if: You have pain in your belly or in the area between your hips. You have a fever or chills. Your symptoms last for more than 2-3 days. Get help right away if: You have a fever and your symptoms get worse all of a sudden. Summary Vaginitis is irritation and swelling of the vagina. Treatment will depend on the cause of the condition. Do not use washes or tampons or have sex until your doctor says it is okay. This information is not intended to replace advice given to you by your health care provider. Make sure you discuss any questions you have with your health care provider. Document Revised: 10/17/2019 Document Reviewed: 10/17/2019 Elsevier Patient Education  2023 [employer] Inc[employer]  

## 2021-09-13 ENCOUNTER — Telehealth: Payer: Self-pay | Admitting: Nurse Practitioner

## 2021-09-13 NOTE — Telephone Encounter (Signed)
Pt called to check on pap results. Advised results have not been received. Please f/u as needed with main lab/pathology with recent equipment issues. ?

## 2021-09-14 ENCOUNTER — Encounter: Payer: Self-pay | Admitting: Nurse Practitioner

## 2021-09-14 ENCOUNTER — Telehealth: Payer: Self-pay | Admitting: Nurse Practitioner

## 2021-09-14 LAB — CERVICOVAGINAL ANCILLARY ONLY
Bacterial Vaginitis (gardnerella): POSITIVE — AB
Candida Glabrata: NEGATIVE
Candida Vaginitis: POSITIVE — AB
Chlamydia: NEGATIVE
Comment: NEGATIVE
Comment: NEGATIVE
Comment: NEGATIVE
Comment: NEGATIVE
Comment: NEGATIVE
Comment: NORMAL
Neisseria Gonorrhea: NEGATIVE
Trichomonas: NEGATIVE

## 2021-09-14 MED ORDER — FLUCONAZOLE 150 MG PO TABS: 150.0000 mg | ORAL_TABLET | Freq: Every day | ORAL | 0 refills | Status: DC

## 2021-09-14 NOTE — Telephone Encounter (Signed)
Pt called to speak to Shannon Marks she is anxious and wants her to know the info in her mychart message. She said if she doesn't answer to please leave a detailed message on my chart ?

## 2021-09-14 NOTE — Telephone Encounter (Signed)
See note

## 2021-09-14 NOTE — Telephone Encounter (Signed)
Levada Dy will be contacting lab regarding this.  ?

## 2021-09-14 NOTE — Addendum Note (Signed)
Addended by: Michaela Corner on: 09/14/2021 03:34 PM ? ? Modules accepted: Orders ? ?

## 2021-09-15 NOTE — Telephone Encounter (Signed)
This has been addressed in another encounter.

## 2021-09-23 ENCOUNTER — Other Ambulatory Visit: Payer: Self-pay | Admitting: Nurse Practitioner

## 2021-09-23 DIAGNOSIS — J069 Acute upper respiratory infection, unspecified: Secondary | ICD-10-CM

## 2022-01-14 ENCOUNTER — Ambulatory Visit: Payer: Managed Care, Other (non HMO)

## 2022-04-19 IMAGING — CT CT ABD-PELV W/ CM
2 of 5 series · 16 of 46 positions shown, 18 images · IV contrast (OMNIPAQUE 300)
Comparison: 5700

CLINICAL DATA: Abdominal pain, nausea, fever

EXAM:
CT ABDOMEN AND PELVIS WITH CONTRAST
TECHNIQUE: Multidetector CT imaging of the abdomen and pelvis was performed
using the standard protocol following bolus administration of
intravenous contrast.
CONTRAST:  100mL OMNIPAQUE IOHEXOL 300 MG/ML  SOLN

[Series 2: axial st · axial · 0.69mm/px · z∈[+1173,+1533]mm · 13 of 86 slices shown, 15 images]
[im 7/86  soft-tissue]
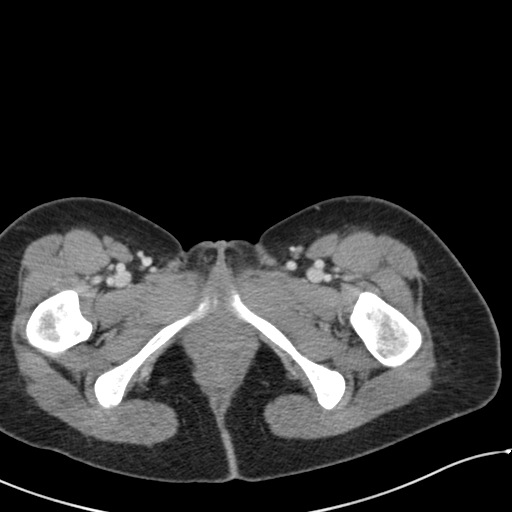
[im 7/86  bone]
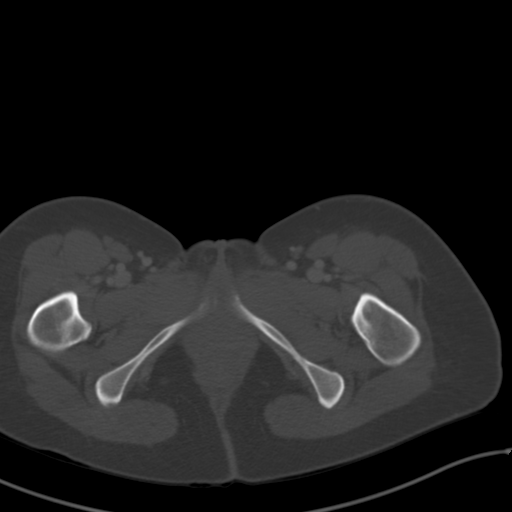
[im 13/86  soft-tissue]
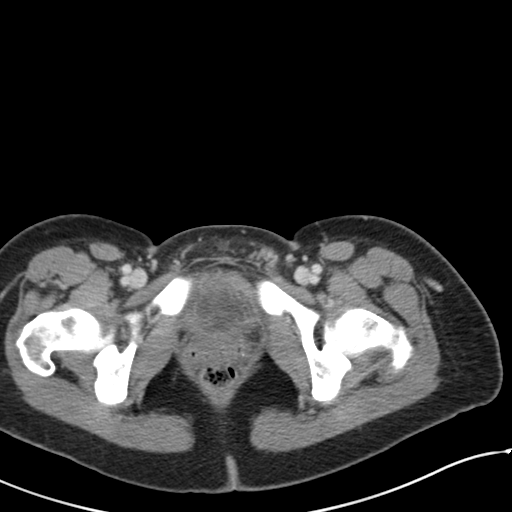
[im 19/86  soft-tissue]
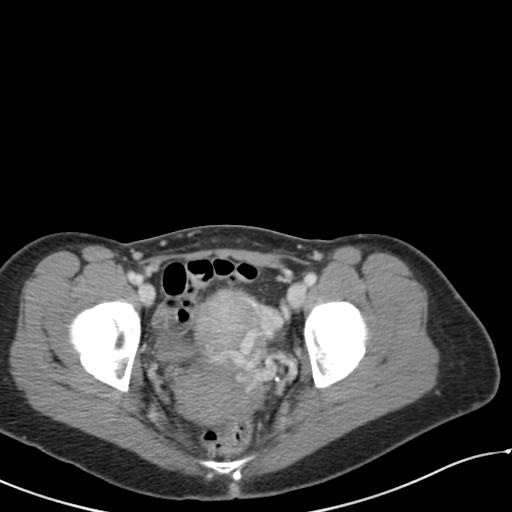
[im 25/86  soft-tissue]
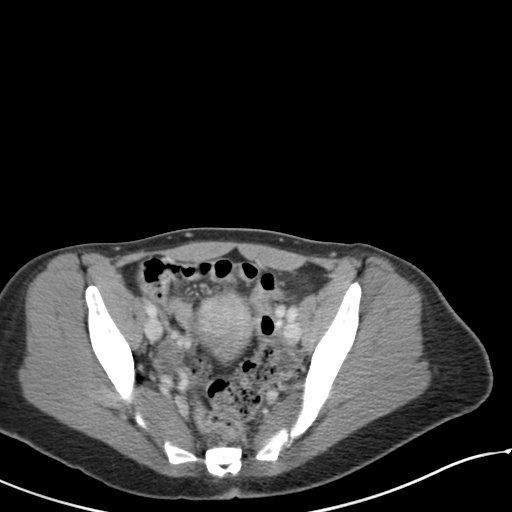
[im 31/86  soft-tissue]
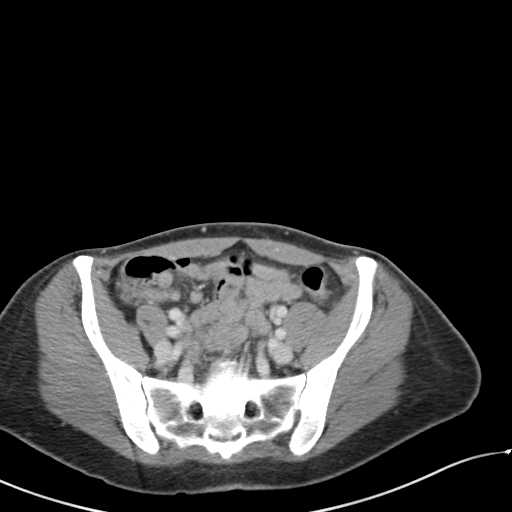
[im 37/86  soft-tissue]
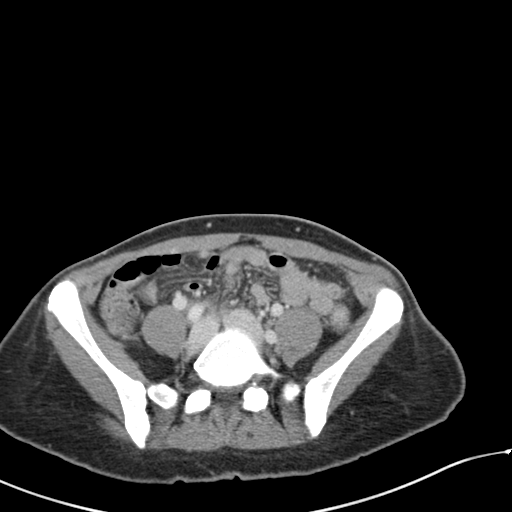
[im 43/86  soft-tissue]
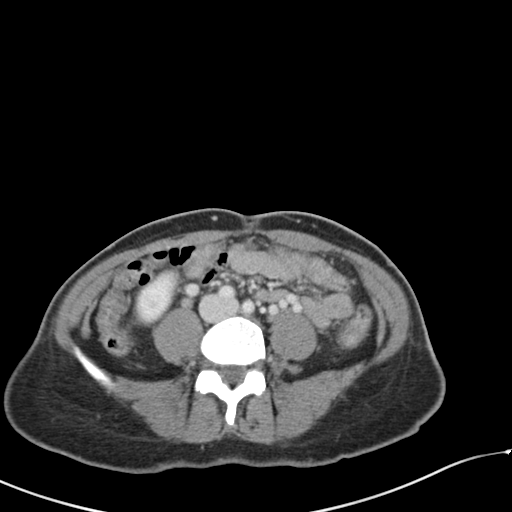
[im 49/86  soft-tissue]
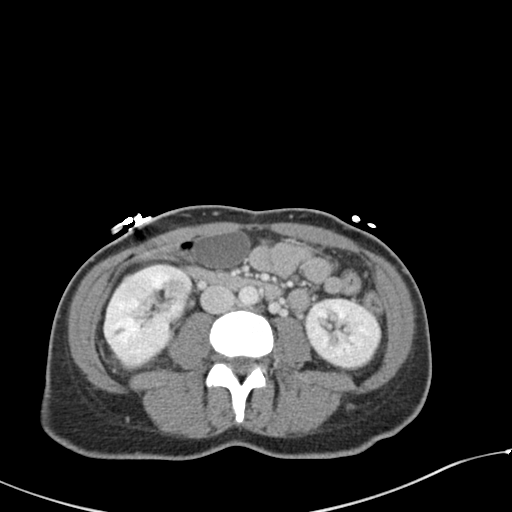
[im 55/86  soft-tissue]
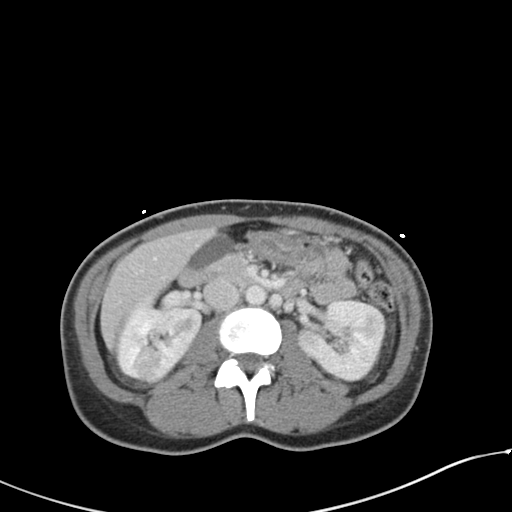
[im 55/86  bone]
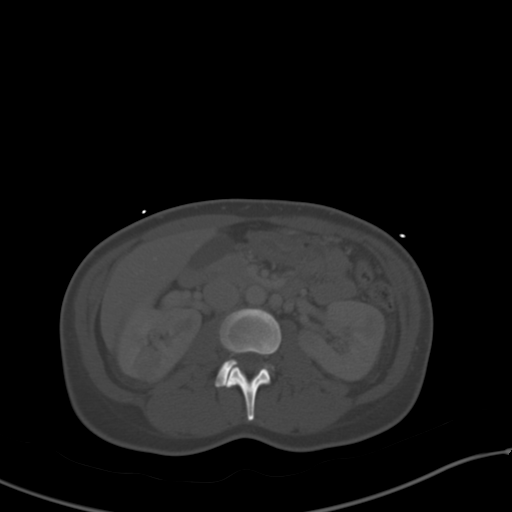
[im 61/86  soft-tissue]
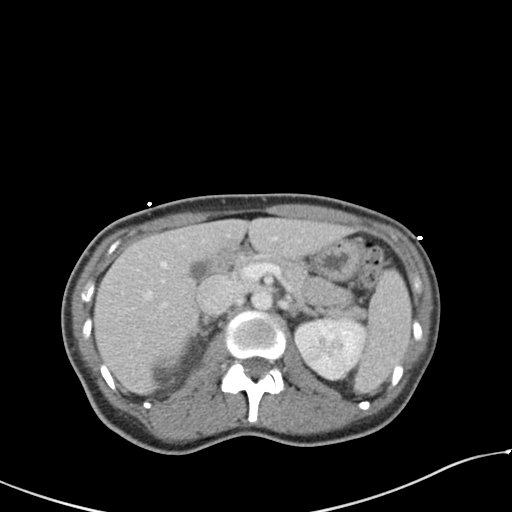
[im 67/86  soft-tissue]
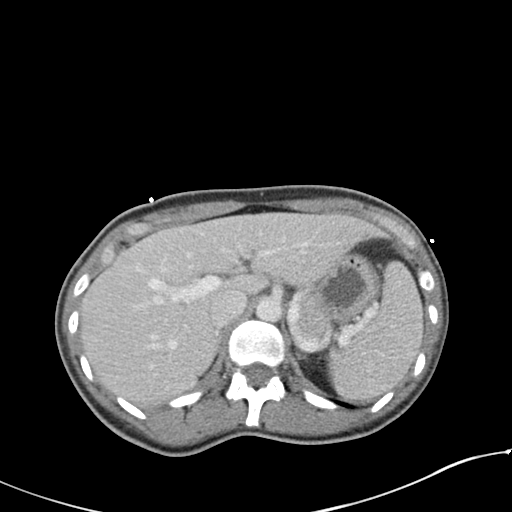
[im 73/86  soft-tissue]
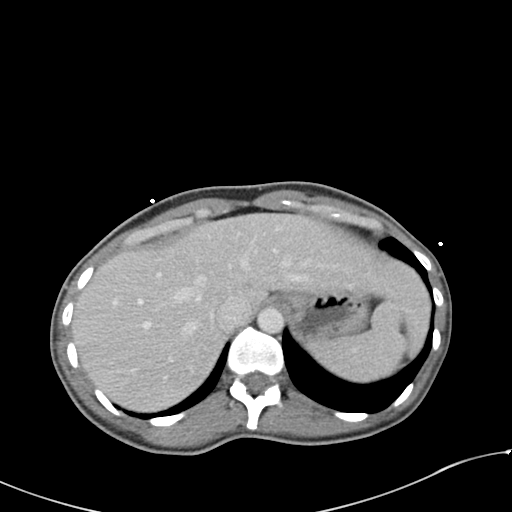
[im 79/86  soft-tissue]
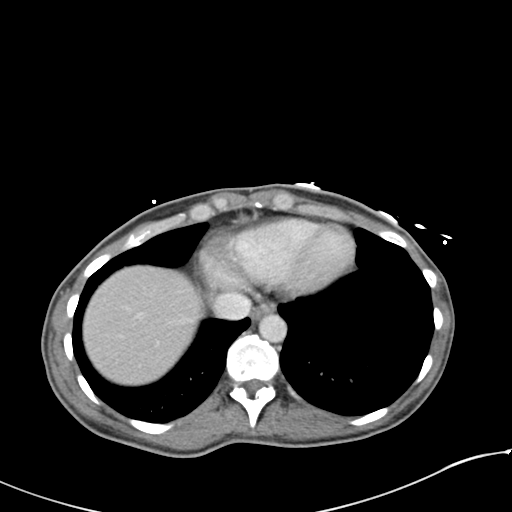

[Series 4: coronal st · coronal · 0.81mm/px · 3 of 95 slices shown]
[im 32/95  soft-tissue]
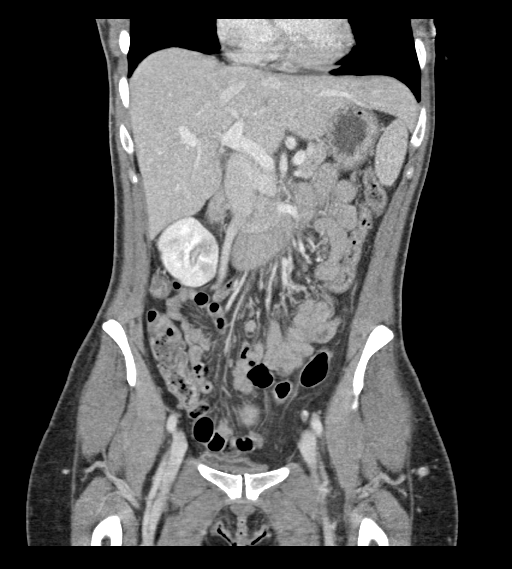
[im 42/95  soft-tissue]
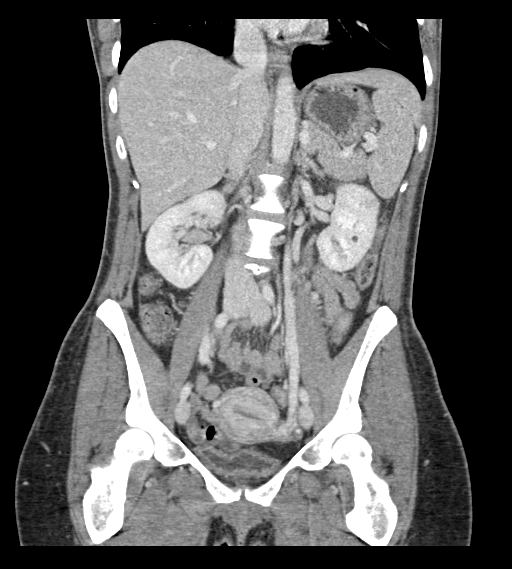
[im 53/95  soft-tissue]
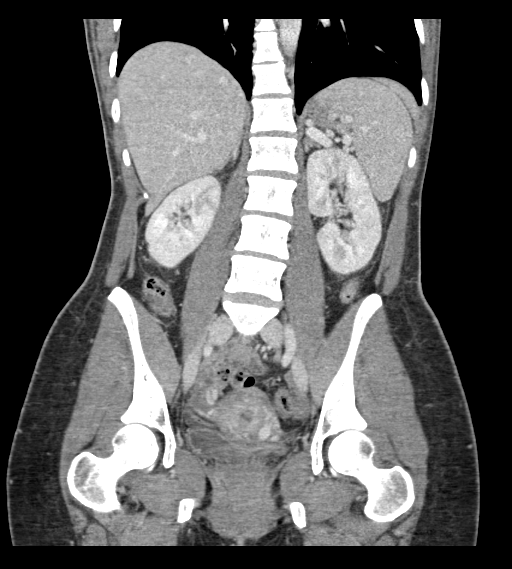

[16 of 46 positions shown; findings below may reference images not displayed]

FINDINGS: Lower chest: No acute abnormality.

Hepatobiliary: No focal liver abnormality is seen. No gallstones,
gallbladder wall thickening, or biliary dilatation.

Pancreas: Unremarkable.

Spleen: Unremarkable.

Adrenals/Urinary Tract: Adrenals are unremarkable. There are subtle
areas of peripheral hypoenhancement in the right greater than left
kidneys. Subcentimeter too small to characterize hypoattenuating
renal lesions bilaterally probably reflect cysts. Mild perinephric
stranding. No calculi. Bladder is poorly distended and therefore not
well evaluated but wall thickening and mucosal hyperenhancement are
present.

Stomach/Bowel: Stomach is within normal limits. Bowel is normal in
caliber. Normal appendix.

Vascular/Lymphatic: No significant vascular findings. No enlarged
lymph nodes identified.

Reproductive: Uterus and bilateral adnexa are unremarkable.

Other: No ascites.  No abdominal wall hernia.

Musculoskeletal: No acute osseous abnormality.
IMPRESSION: Suspected cystitis and bilateral pyelonephritis.  No calculi.

## 2022-08-05 ENCOUNTER — Other Ambulatory Visit (HOSPITAL_COMMUNITY)
Admission: RE | Admit: 2022-08-05 | Discharge: 2022-08-05 | Disposition: A | Discharge status: Home / Self Care | Payer: 59 | Source: Ambulatory Visit | Attending: Nurse Practitioner | Admitting: Nurse Practitioner

## 2022-08-05 ENCOUNTER — Encounter: Payer: Self-pay | Admitting: Nurse Practitioner

## 2022-08-05 ENCOUNTER — Ambulatory Visit (INDEPENDENT_AMBULATORY_CARE_PROVIDER_SITE_OTHER): Payer: 59 | Admitting: Nurse Practitioner

## 2022-08-05 VITALS — BP 100/70 | HR 66 | Temp 98.2°F | Resp 16 | Ht 62.0 in | Wt 144.6 lb

## 2022-08-05 DIAGNOSIS — N898 Other specified noninflammatory disorders of vagina: Secondary | ICD-10-CM | POA: Insufficient documentation

## 2022-08-05 DIAGNOSIS — B9689 Other specified bacterial agents as the cause of diseases classified elsewhere: Secondary | ICD-10-CM | POA: Diagnosis not present

## 2022-08-05 DIAGNOSIS — Z124 Encounter for screening for malignant neoplasm of cervix: Secondary | ICD-10-CM | POA: Insufficient documentation

## 2022-08-05 DIAGNOSIS — N760 Acute vaginitis: Secondary | ICD-10-CM | POA: Diagnosis not present

## 2022-08-05 DIAGNOSIS — Z1159 Encounter for screening for other viral diseases: Secondary | ICD-10-CM

## 2022-08-05 DIAGNOSIS — R87610 Atypical squamous cells of undetermined significance on cytologic smear of cervix (ASC-US): Secondary | ICD-10-CM | POA: Diagnosis not present

## 2022-08-05 NOTE — Progress Notes (Signed)
Established Patient Visit  Patient: Shannon Marks   DOB: Jul 28, 1980   42 y.o. Female  MRN: [id number] Visit Date: 08/05/2022  Subjective:    Chief Complaint  Patient presents with   Vaginitis    Pt is having a white discharge. Requesting a PAP and full STD check    Vaginal Discharge The patient's primary symptoms include vaginal discharge. The patient's pertinent negatives include no genital itching, genital lesions, genital odor, genital rash, missed menses, pelvic pain or vaginal bleeding. This is a new problem. The current episode started yesterday. The problem has been unchanged. The patient is experiencing no pain. She is not pregnant. Pertinent negatives include no anorexia, back pain, chills, frequency, painful intercourse or rash. The vaginal discharge was white. The vaginal bleeding is typical of menses. She has not been passing clots. She has not been passing tissue. The treatment provided no relief. She is sexually active. It is unknown whether or not her partner has an STD. She uses tubal ligation for contraception. Her menstrual history has been regular. Her past medical history is significant for herpes simplex and vaginosis. There is no history of PID or an STD.   Reviewed medical, surgical, and social history today  Medications: Outpatient Medications Prior to Visit  Medication Sig   albuterol (VENTOLIN HFA) 108 (90 Base) MCG/ACT inhaler Inhale 2 puffs into the lungs every 6 (six) hours as needed for wheezing or shortness of breath.   drospirenone-ethinyl estradiol (YAZ) 3-0.02 MG tablet Take 1 tablet by mouth daily.   fluconazole (DIFLUCAN) 150 MG tablet Take 1 tablet (150 mg total) by mouth daily. Take second tab 3days apart from first tab (Patient not taking: Reported on 08/05/2022)   fluticasone (FLONASE) 50 MCG/ACT nasal spray Place 2 sprays into both nostrils daily.   oxymetazoline (AFRIN NASAL SPRAY) 0.05 % nasal spray Place 1 spray into both nostrils 2  (two) times daily.   clindamycin-benzoyl peroxide (BENZACLIN) gel Apply topically 2 (two) times daily. (Patient not taking: Reported on 08/27/2021)   guaiFENesin (MUCINEX) 600 MG 12 hr tablet Take 1 tablet (600 mg total) by mouth 2 (two) times daily as needed for cough or to loosen phlegm. (Patient not taking: Reported on 08/05/2022)   HYDROcodone bit-homatropine (HYCODAN) 5-1.5 MG/5ML syrup Take 5 mLs by mouth every 8 (eight) hours as needed for cough. (Patient not taking: Reported on 08/05/2022)   metroNIDAZOLE (METROGEL VAGINAL) 0.75 % vaginal gel Place 1 Applicatorful vaginally at bedtime. (Patient not taking: Reported on 08/05/2022)   No facility-administered medications prior to visit.   Reviewed past medical and social history.   ROS per HPI above      Objective:  BP 100/70 (BP Location: Right Arm, Patient Position: Sitting, Cuff Size: Normal)   Pulse 66   Temp 98.2 F (36.8 C) (Temporal)   Resp 16   Ht 5\' 2"  (1.575 m)   Wt 144 lb 9.6 oz (65.6 kg)   LMP 06/30/2022 (Exact Date)   SpO2 92%   BMI 26.45 kg/m      Physical Exam Vitals and nursing note reviewed. Exam conducted with a chaperone present.  Abdominal:     Hernia: There is no hernia in the left inguinal area or right inguinal area.  Genitourinary:    General: Normal vulva.     Exam position: Lithotomy position.     Labia:        Right: No rash, tenderness,  lesion or injury.        Left: No rash, tenderness, lesion or injury.      Vagina: Vaginal discharge present. No erythema, tenderness or bleeding.     Cervix: Discharge present. No cervical motion tenderness, friability, lesion, erythema or cervical bleeding.     Uterus: Normal.      Adnexa: Right adnexa normal and left adnexa normal.  Lymphadenopathy:     Lower Body: No right inguinal adenopathy. No left inguinal adenopathy.     No results found for any visits on 08/05/22.    Assessment & Plan:    Problem List Items Addressed This Visit   None Visit  Diagnoses     Vaginal discharge    -  Primary   Relevant Orders   Cervicovaginal ancillary only   Atypical squamous cells of undetermined significance on cytologic smear of cervix (ASC-US)       Relevant Orders   Cytology - PAP   Encounter for screening for viral disease       Relevant Orders   Cervicovaginal ancillary only   Hepatitis C antibody   Hep B Core Ab W/Reflex   HIV Antibody (routine testing w rflx)   RPR      Return in about 3 months (around 11/04/2022) for CPE (fasting).     Alysia Penna, NP

## 2022-08-05 NOTE — Patient Instructions (Signed)
Go to lab

## 2022-08-06 LAB — HEPATITIS B CORE AB W/REFLEX: Hep B Core Total Ab: NEGATIVE

## 2022-08-08 LAB — CERVICOVAGINAL ANCILLARY ONLY
Bacterial Vaginitis (gardnerella): POSITIVE — AB
Candida Glabrata: NEGATIVE
Candida Vaginitis: NEGATIVE
Chlamydia: NEGATIVE
Comment: NEGATIVE
Comment: NEGATIVE
Comment: NEGATIVE
Comment: NEGATIVE
Comment: NEGATIVE
Comment: NORMAL
Neisseria Gonorrhea: NEGATIVE
Trichomonas: NEGATIVE

## 2022-08-08 LAB — HIV ANTIBODY (ROUTINE TESTING W REFLEX): HIV 1&2 Ab, 4th Generation: NONREACTIVE

## 2022-08-08 LAB — HEPATITIS C ANTIBODY: Hepatitis C Ab: NONREACTIVE

## 2022-08-08 LAB — RPR: RPR Ser Ql: NONREACTIVE

## 2022-08-08 MED ORDER — METRONIDAZOLE 0.75 % VA GEL
1.0000 | Freq: Every day | VAGINAL | 0 refills | Status: DC
Start: 2022-08-08 — End: 2023-03-17

## 2022-08-08 NOTE — Addendum Note (Signed)
Addended by: Alysia Penna L on: 08/08/2022 04:11 PM   Modules accepted: Orders

## 2022-08-08 NOTE — Progress Notes (Signed)
Stable Follow instructions as discussed during office visit.

## 2022-08-09 LAB — CYTOLOGY - PAP
Comment: NEGATIVE
Diagnosis: NEGATIVE
High risk HPV: NEGATIVE

## 2022-08-09 NOTE — Progress Notes (Signed)
Stable Follow instructions as discussed during office visit.

## 2022-08-11 ENCOUNTER — Telehealth: Payer: Self-pay | Admitting: Nurse Practitioner

## 2022-08-11 ENCOUNTER — Other Ambulatory Visit: Payer: Self-pay

## 2022-08-11 MED ORDER — FLUCONAZOLE 150 MG PO TABS: 150.0000 mg | ORAL_TABLET | Freq: Once | ORAL | 0 refills | Status: AC

## 2022-08-11 NOTE — Telephone Encounter (Signed)
Medication has been sent to the Pharmacy.  Notified patient via My chart

## 2022-08-11 NOTE — Telephone Encounter (Signed)
Prescription Request  08/11/2022  LOV: 08/05/2022  What is the name of the medication or equipment? Fluconazole Brand name: Diflucan  Have you contacted your pharmacy to request a refill? Yes, she is wanting a script for this med.  Which pharmacy would you like this sent to?  [id number] [place], [place] - [address] AT [facility] [address] [place] [address] Phone: [phone] Fax: [phone]    Patient notified that their request is being sent to the clinical staff for review and that they should receive a response within 2 business days.   Please advise at Mobile [phone] (mobile)

## 2022-10-07 ENCOUNTER — Encounter: Payer: 59 | Admitting: Nurse Practitioner

## 2022-12-02 ENCOUNTER — Telehealth: Payer: Self-pay | Admitting: Nurse Practitioner

## 2022-12-02 ENCOUNTER — Encounter: Payer: 59 | Admitting: Nurse Practitioner

## 2022-12-02 NOTE — Telephone Encounter (Signed)
Pt was a no show for a cpe with Claris Gower on 12/02/22, I sent a no show letter.

## 2022-12-07 NOTE — Telephone Encounter (Signed)
1st no show w/in 12 months. Last 05/2021.

## 2022-12-30 ENCOUNTER — Encounter: Payer: Self-pay | Admitting: Nurse Practitioner

## 2022-12-30 ENCOUNTER — Ambulatory Visit (INDEPENDENT_AMBULATORY_CARE_PROVIDER_SITE_OTHER): Payer: 59 | Admitting: Nurse Practitioner

## 2022-12-30 ENCOUNTER — Ambulatory Visit: Payer: 59 | Admitting: Nurse Practitioner

## 2022-12-30 VITALS — BP 104/68 | HR 67 | Temp 98.5°F | Resp 16 | Ht 62.0 in | Wt 138.6 lb

## 2022-12-30 DIAGNOSIS — J202 Acute bronchitis due to streptococcus: Secondary | ICD-10-CM | POA: Diagnosis not present

## 2022-12-30 DIAGNOSIS — J020 Streptococcal pharyngitis: Secondary | ICD-10-CM

## 2022-12-30 DIAGNOSIS — J069 Acute upper respiratory infection, unspecified: Secondary | ICD-10-CM

## 2022-12-30 LAB — POCT RAPID STREP A (OFFICE): Rapid Strep A Screen: POSITIVE — AB

## 2022-12-30 LAB — POC COVID19 BINAXNOW: SARS Coronavirus 2 Ag: NEGATIVE

## 2022-12-30 MED ORDER — METHYLPREDNISOLONE 4 MG PO TBPK: ORAL_TABLET | ORAL | 0 refills | Status: DC

## 2022-12-30 MED ORDER — ALBUTEROL SULFATE HFA 108 (90 BASE) MCG/ACT IN AERS: 1.0000 | INHALATION_SPRAY | Freq: Four times a day (QID) | RESPIRATORY_TRACT | 0 refills | Status: DC | PRN

## 2022-12-30 MED ORDER — BENZONATATE 100 MG PO CAPS: 100.0000 mg | ORAL_CAPSULE | Freq: Three times a day (TID) | ORAL | 0 refills | Status: DC | PRN

## 2022-12-30 MED ORDER — PENICILLIN V POTASSIUM 500 MG PO TABS
500.0000 mg | ORAL_TABLET | Freq: Three times a day (TID) | ORAL | 0 refills | Status: AC
Start: 2022-12-30 — End: 2023-01-09

## 2022-12-30 MED ORDER — ALBUTEROL SULFATE (2.5 MG/3ML) 0.083% IN NEBU
2.5000 mg | INHALATION_SOLUTION | Freq: Once | RESPIRATORY_TRACT | Status: AC
Start: 2022-12-30 — End: 2022-12-30
  Administered 2022-12-30: 2.5 mg via RESPIRATORY_TRACT

## 2022-12-30 NOTE — Progress Notes (Signed)
Established Patient Visit  Patient: ALAIIA Marks   DOB: 11-28-1980   42 y.o. Female  MRN: [id number] Visit Date: 12/30/2022  Subjective:    Chief Complaint  Patient presents with   URI    Cough, chest pains, ear pain. Started 2 days ago    URI  This is a new problem. The current episode started in the past 7 days. The problem has been unchanged. There has been no fever. Associated symptoms include chest pain, congestion, coughing, ear pain, headaches, joint pain, a plugged ear sensation, rhinorrhea, sinus pain, sneezing, a sore throat and wheezing. Pertinent negatives include no abdominal pain or swollen glands. Treatments tried: OTC cough suppressant. The treatment provided no relief.   Reviewed medical, surgical, and social history today  Medications: Outpatient Medications Prior to Visit  Medication Sig   drospirenone-ethinyl estradiol (YAZ) 3-0.02 MG tablet Take 1 tablet by mouth daily.   fluticasone (FLONASE) 50 MCG/ACT nasal spray Place 2 sprays into both nostrils daily.   oxymetazoline (AFRIN NASAL SPRAY) 0.05 % nasal spray Place 1 spray into both nostrils 2 (two) times daily.   metroNIDAZOLE (METROGEL VAGINAL) 0.75 % vaginal gel Place 1 Applicatorful vaginally at bedtime. (Patient not taking: Reported on 12/30/2022)   [DISCONTINUED] albuterol (VENTOLIN HFA) 108 (90 Base) MCG/ACT inhaler Inhale 2 puffs into the lungs every 6 (six) hours as needed for wheezing or shortness of breath. (Patient not taking: Reported on 12/30/2022)   No facility-administered medications prior to visit.   Reviewed past medical and social history.   ROS per HPI above      Objective:  BP 104/68 (BP Location: Left Arm, Patient Position: Sitting, Cuff Size: Normal)   Pulse 67   Temp 98.5 F (36.9 C) (Oral)   Resp 16   Ht 5\' 2"  (1.575 m)   Wt 138 lb 9.6 oz (62.9 kg)   LMP 12/16/2022 (Approximate)   SpO2 98%   BMI 25.35 kg/m      Physical Exam Vitals and nursing note  reviewed.  HENT:     Right Ear: Tympanic membrane, ear canal and external ear normal.     Left Ear: Tympanic membrane, ear canal and external ear normal.     Mouth/Throat:     Pharynx: Uvula midline. Posterior oropharyngeal erythema present. No oropharyngeal exudate.     Tonsils: No tonsillar exudate.  Cardiovascular:     Rate and Rhythm: Normal rate and regular rhythm.     Pulses: Normal pulses.     Heart sounds: Normal heart sounds.  Pulmonary:     Effort: Pulmonary effort is normal.     Breath sounds: Wheezing present.     Comments: Bilateral equal and clear lung sounds after nebulizer treatment Neurological:     Mental Status: She is alert and oriented to person, place, and time.     Results for orders placed or performed in visit on 12/30/22  POC COVID-19  Result Value Ref Range   SARS Coronavirus 2 Ag Negative Negative  POCT rapid strep A  Result Value Ref Range   Rapid Strep A Screen Positive (A) Negative      Assessment & Plan:    Problem List Items Addressed This Visit   None Visit Diagnoses     Viral upper respiratory tract infection    -  Primary   Relevant Medications   benzonatate (TESSALON) 100 MG capsule   albuterol (VENTOLIN HFA) 108 (  90 Base) MCG/ACT inhaler   Other Relevant Orders   POC COVID-19 (Completed)   Acute streptococcal pharyngitis       Relevant Medications   methylPREDNISolone (MEDROL DOSEPAK) 4 MG TBPK tablet   penicillin v potassium (VEETID) 500 MG tablet   Other Relevant Orders   POCT rapid strep A (Completed)   Acute bronchitis due to Streptococcus       Relevant Medications   albuterol (PROVENTIL) (2.5 MG/3ML) 0.083% nebulizer solution 2.5 mg (Completed)   methylPREDNISolone (MEDROL DOSEPAK) 4 MG TBPK tablet   benzonatate (TESSALON) 100 MG capsule      Return if symptoms worsen or fail to improve.     Alysia Penna, NP

## 2022-12-30 NOTE — Patient Instructions (Addendum)
Maintain adequate oral hydration.  Strep Throat, Adult Strep throat is an infection of the throat. It is caused by germs (bacteria). Strep throat is common during the cold months of the year. It mostly affects children who are 80-43 years old. However, people of all ages can get it at any time of the year. This infection spreads from person to person through coughing, sneezing, or having close contact. What are the causes? This condition is caused by the Streptococcus pyogenes germ. What increases the risk? 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 Children are more likely to get strep throat and may spread it to others. You go to crowded places. Germs can spread easily in such places. You kiss or touch someone who has strep throat. What are the signs or symptoms? Fever or chills. Redness, swelling, or pain in the tonsils or throat. Pain or trouble when swallowing. White or yellow spots on the tonsils or throat. Tender glands in the neck and under the jaw. Bad breath. Red rash all over the body. This is rare. How is this treated? Medicines that kill germs (antibiotics). Medicines that treat pain or fever. These include: Ibuprofen or acetaminophen. Aspirin, only for people who are over the age of 58. Cough drops. Throat sprays. Follow these instructions at home: Medicines  Take over-the-counter and prescription medicines only as told by your doctor. Take your antibiotic medicine as told by your doctor. Do not stop taking the antibiotic even if you start to feel better. Eating and drinking  If you have trouble swallowing, eat soft foods until your throat feels better. Drink enough fluid to keep your pee (urine) pale yellow. To help with pain, you may have: Warm fluids, such as soup and tea. Cold fluids, such as frozen desserts or popsicles. General instructions Rinse your mouth (gargle) with a salt-water mixture 3-4 times a day or as needed. To make a salt-water mixture, dissolve -1 tsp (3-6  g) of salt in 1 cup (237 mL) of warm water. Rest as much as you can. Stay home from work or school until you have been taking antibiotics for 24 hours. Do not smoke or use any products that contain nicotine or tobacco. If you need help quitting, ask your doctor. Keep all follow-up visits. How is this prevented?  Do not share food, drinking cups, or personal items. They can cause the germs to spread. Wash your hands well with soap and water. Make sure that all people in your house wash their hands well. Have family members tested if they have a fever or a sore throat. They may need an antibiotic if they have strep throat. Contact a doctor if: You have swelling in your neck that keeps getting bigger. You get a rash, cough, or earache. You cough up a thick fluid that is green, yellow-brown, or bloody. You have pain that does not get better with medicine. Your symptoms get worse instead of getting better. You have a fever. Get help right away if: You vomit. You have a very bad headache. Your neck hurts or feels stiff. You have chest pain or are short of breath. You have drooling, very bad throat pain, or changes in your voice. Your neck is swollen, or the skin gets red and tender. Your mouth is dry, or you are peeing less than normal. You keep feeling more tired or have trouble waking up. Your joints are red or painful. These symptoms may be an emergency. Do not wait to see if the symptoms will go away.  Get help right away. Call your local emergency services (911 in the [place] Summary Strep throat is an infection of the throat. It is caused by germs (bacteria). This infection can spread from person to person through coughing, sneezing, or having close contact. Take your medicines, including antibiotics, as told by your doctor. Do not stop taking the antibiotic even if you start to feel better. To prevent the spread of germs, wash your hands well with soap and water. Have others do the  same. Do not share food, drinking cups, or personal items. Get help right away if you have a bad headache, chest pain, shortness of breath, a stiff or painful neck, or you vomit. This information is not intended to replace advice given to you by your health care provider. Make sure you discuss any questions you have with your health care provider. Document Revised: 08/11/2020 Document Reviewed: 08/11/2020 Elsevier Patient Education  2024 [employer].

## 2023-01-06 ENCOUNTER — Other Ambulatory Visit: Payer: Self-pay | Admitting: Nurse Practitioner

## 2023-01-06 DIAGNOSIS — J020 Streptococcal pharyngitis: Secondary | ICD-10-CM

## 2023-01-06 MED ORDER — AZITHROMYCIN 250 MG PO TABS
250.0000 mg | ORAL_TABLET | Freq: Every day | ORAL | 0 refills | Status: DC
Start: 2023-01-06 — End: 2023-02-03

## 2023-01-06 NOTE — Telephone Encounter (Signed)
Patient called back into the office to inquire about what Claris Gower was going to do about the medication refill request. Informed patient of Anne Ng, NP recommendation of stopping the penicillin and to start azithromycin (z-pak) 250 mg. New rx sent  and that she should schedule an office visit if no improvement with azithromycin. The patient verbalized understanding.  All questions (if any) were answered.

## 2023-01-06 NOTE — Telephone Encounter (Signed)
Pt was seen on 12/30/22 by Claris Gower for strep throat, she was prescribed penicillin v potassium (VEETID) 500 MG tablet [[id number]]. Her throat is still sore, red and scratchy. She is wanting a refill on this   [id number] [place], [place] - [address] AT [facility] [address] [place], [place] [address] Phone: [phone]  Fax: [phone] DEA #: [id number]

## 2023-01-06 NOTE — Telephone Encounter (Signed)
Called patient to inquire about her symptoms. She stated that they are the same as when she came into the office for a visit and that they haven't cleared nor have they gotten worse. Informed patient that I will give this information to Alysia Penna, NP and get back with her.

## 2023-01-21 ENCOUNTER — Other Ambulatory Visit: Payer: Self-pay | Admitting: Nurse Practitioner

## 2023-01-21 DIAGNOSIS — J069 Acute upper respiratory infection, unspecified: Secondary | ICD-10-CM

## 2023-02-03 ENCOUNTER — Ambulatory Visit (INDEPENDENT_AMBULATORY_CARE_PROVIDER_SITE_OTHER): Payer: 59 | Admitting: Nurse Practitioner

## 2023-02-03 VITALS — BP 128/80 | HR 73 | Temp 97.9°F | Ht 62.0 in | Wt 134.0 lb

## 2023-02-03 DIAGNOSIS — J020 Streptococcal pharyngitis: Secondary | ICD-10-CM

## 2023-02-03 DIAGNOSIS — H66002 Acute suppurative otitis media without spontaneous rupture of ear drum, left ear: Secondary | ICD-10-CM

## 2023-02-03 DIAGNOSIS — J209 Acute bronchitis, unspecified: Secondary | ICD-10-CM | POA: Diagnosis not present

## 2023-02-03 LAB — POCT INFLUENZA A/B
Influenza A, POC: NEGATIVE
Influenza B, POC: NEGATIVE

## 2023-02-03 LAB — POC COVID19 BINAXNOW: SARS Coronavirus 2 Ag: NEGATIVE

## 2023-02-03 LAB — POCT RAPID STREP A (OFFICE)
Rapid Strep A Screen: NEGATIVE
Rapid Strep A Screen: POSITIVE — AB

## 2023-02-03 MED ORDER — METHYLPREDNISOLONE 4 MG PO TBPK
ORAL_TABLET | ORAL | 0 refills | Status: DC
Start: 2023-02-03 — End: 2023-03-17

## 2023-02-03 MED ORDER — AMOXICILLIN-POT CLAVULANATE 875-125 MG PO TABS
1.0000 | ORAL_TABLET | Freq: Two times a day (BID) | ORAL | 0 refills | Status: DC
Start: 2023-02-03 — End: 2023-03-17

## 2023-02-03 MED ORDER — PROMETHAZINE-DM 6.25-15 MG/5ML PO SYRP
5.0000 mL | ORAL_SOLUTION | Freq: Three times a day (TID) | ORAL | 0 refills | Status: DC | PRN
Start: 2023-02-03 — End: 2023-03-17

## 2023-02-03 NOTE — Patient Instructions (Signed)
URI Instructions: Encourage adequate oral hydration. Use over-the-counter  cold" medicine  such as Dayquil/nyquil for cough and congestion.  Use mucinex DM or Robitussin  or delsym for cough without congestion  You can use plain "Tylenol" or "Advil" for fever, chills and achyness. Use cool mist humidifier at bedtime to help with nasal congestion and cough.  Cold/cough medications may have tylenol or ibuprofen or guaifenesin or dextromethophan in them, so be careful not to take beyond the recommended dose for each of these medications.

## 2023-02-07 ENCOUNTER — Telehealth: Payer: Self-pay | Admitting: Nurse Practitioner

## 2023-02-07 NOTE — Telephone Encounter (Signed)
Pt was asked to call back if not feeling any better from her 02/03/23 sick visit with Claris Gower. She still has a sore throat, coughing, congestion, ears hurt, no fever and no diarrhea. Please advise pt at [phone]   Please contact pt through mychart, she has to go back to work.

## 2023-02-07 NOTE — Telephone Encounter (Signed)
Please advise 

## 2023-02-07 NOTE — Progress Notes (Signed)
Established Patient Visit  Patient: Shannon Marks   DOB: 01-Feb-1981   42 y.o. Female  MRN: [id number] Visit Date: 02/07/2023  Subjective:    Chief Complaint  Patient presents with   Cough    Pt c/o sore throat that started last night along with vomiting.   Ear Pain    Pt c/o left ear pain.   Cough Associated symptoms include ear pain, headaches, rhinorrhea and a sore throat. Pertinent negatives include no chest pain, rash or wheezing.  URI  This is a new problem. The current episode started in the past 7 days. The problem has been unchanged. There has been no fever. Associated symptoms include congestion, coughing, ear pain, headaches, joint pain, nausea, a plugged ear sensation, rhinorrhea, sinus pain, sneezing, a sore throat, swollen glands and vomiting. Pertinent negatives include no abdominal pain, chest pain, diarrhea, dysuria, rash or wheezing. She has tried acetaminophen for the symptoms. The treatment provided no relief.   Reviewed medical, surgical, and social history today  Medications: Outpatient Medications Prior to Visit  Medication Sig   albuterol (VENTOLIN HFA) 108 (90 Base) MCG/ACT inhaler Inhale 1 puff into the lungs every 6 (six) hours as needed for wheezing or shortness of breath. Need office visit for additional refill   drospirenone-ethinyl estradiol (YAZ) 3-0.02 MG tablet Take 1 tablet by mouth daily.   fluticasone (FLONASE) 50 MCG/ACT nasal spray Place 2 sprays into both nostrils daily.   metroNIDAZOLE (METROGEL VAGINAL) 0.75 % vaginal gel Place 1 Applicatorful vaginally at bedtime.   oxymetazoline (AFRIN NASAL SPRAY) 0.05 % nasal spray Place 1 spray into both nostrils 2 (two) times daily.   [DISCONTINUED] azithromycin (ZITHROMAX Z-PAK) 250 MG tablet Take 1 tablet (250 mg total) by mouth daily. Take 2tabs on first day, then 1tab once a day till complete (Patient not taking: Reported on 02/03/2023)   [DISCONTINUED] benzonatate (TESSALON) 100 MG  capsule Take 1-2 capsules (100-200 mg total) by mouth 3 (three) times daily as needed. (Patient not taking: Reported on 02/03/2023)   [DISCONTINUED] methylPREDNISolone (MEDROL DOSEPAK) 4 MG TBPK tablet Take as directed on package (Patient not taking: Reported on 02/03/2023)   No facility-administered medications prior to visit.   Reviewed past medical and social history.   ROS per HPI above      Objective:  BP 128/80   Pulse 73   Temp 97.9 F (36.6 C)   Ht 5\' 2"  (1.575 m)   Wt 134 lb (60.8 kg)   SpO2 100%   BMI 24.51 kg/m      Physical Exam Constitutional:      General: She is not in acute distress. HENT:     Right Ear: Tympanic membrane, ear canal and external ear normal.     Left Ear: Ear canal and external ear normal. Tenderness present. A middle ear effusion is present. No mastoid tenderness. Tympanic membrane is injected, erythematous and retracted. Tympanic membrane is not scarred or perforated.     Nose: No nasal tenderness, mucosal edema, congestion or rhinorrhea.     Right Nostril: No occlusion.     Left Nostril: No occlusion.     Right Turbinates: Not enlarged, swollen or pale.     Left Turbinates: Not enlarged, swollen or pale.     Right Sinus: No maxillary sinus tenderness or frontal sinus tenderness.     Left Sinus: No maxillary sinus tenderness or frontal sinus tenderness.  Mouth/Throat:     Pharynx: Uvula midline. Posterior oropharyngeal erythema present. No oropharyngeal exudate.     Tonsils: No tonsillar exudate or tonsillar abscesses. 2+ on the right. 2+ on the left.  Eyes:     Extraocular Movements: Extraocular movements intact.     Conjunctiva/sclera: Conjunctivae normal.  Cardiovascular:     Rate and Rhythm: Normal rate and regular rhythm.     Pulses: Normal pulses.     Heart sounds: Normal heart sounds.  Pulmonary:     Effort: Pulmonary effort is normal.     Breath sounds: Normal breath sounds.  Musculoskeletal:     Cervical back: Normal range  of motion and neck supple.  Lymphadenopathy:     Cervical: No cervical adenopathy.  Neurological:     Mental Status: She is alert and oriented to person, place, and time.     Results for orders placed or performed in visit on 02/03/23  POCT rapid strep A  Result Value Ref Range   Rapid Strep A Screen Negative Negative  POCT Influenza A/B  Result Value Ref Range   Influenza A, POC Negative Negative   Influenza B, POC Negative Negative  POC COVID-19  Result Value Ref Range   SARS Coronavirus 2 Ag Negative Negative  POCT rapid strep A  Result Value Ref Range   Rapid Strep A Screen Positive (A) Negative      Assessment & Plan:    Problem List Items Addressed This Visit   None Visit Diagnoses     Acute streptococcal pharyngitis    -  Primary   Relevant Medications   methylPREDNISolone (MEDROL DOSEPAK) 4 MG TBPK tablet   amoxicillin-clavulanate (AUGMENTIN) 875-125 MG tablet   promethazine-dextromethorphan (PROMETHAZINE-DM) 6.25-15 MG/5ML syrup   Other Relevant Orders   POCT rapid strep A (Completed)   POCT rapid strep A (Completed)   Acute bronchitis, unspecified organism       Relevant Medications   methylPREDNISolone (MEDROL DOSEPAK) 4 MG TBPK tablet   amoxicillin-clavulanate (AUGMENTIN) 875-125 MG tablet   promethazine-dextromethorphan (PROMETHAZINE-DM) 6.25-15 MG/5ML syrup   Other Relevant Orders   POCT Influenza A/B (Completed)   POC COVID-19 (Completed)   Non-recurrent acute suppurative otitis media of left ear without spontaneous rupture of tympanic membrane       Relevant Medications   methylPREDNISolone (MEDROL DOSEPAK) 4 MG TBPK tablet   amoxicillin-clavulanate (AUGMENTIN) 875-125 MG tablet   promethazine-dextromethorphan (PROMETHAZINE-DM) 6.25-15 MG/5ML syrup      Return if symptoms worsen or fail to improve.     Alysia Penna, NP

## 2023-02-09 ENCOUNTER — Encounter: Payer: Self-pay | Admitting: Nurse Practitioner

## 2023-02-09 ENCOUNTER — Ambulatory Visit: Payer: 59 | Admitting: Nurse Practitioner

## 2023-02-09 VITALS — BP 120/80 | HR 75 | Temp 98.3°F | Ht 62.0 in | Wt 137.4 lb

## 2023-02-09 DIAGNOSIS — Z202 Contact with and (suspected) exposure to infections with a predominantly sexual mode of transmission: Secondary | ICD-10-CM | POA: Diagnosis not present

## 2023-02-09 DIAGNOSIS — Z1159 Encounter for screening for other viral diseases: Secondary | ICD-10-CM | POA: Diagnosis not present

## 2023-02-09 NOTE — Progress Notes (Signed)
Acute Office Visit  Subjective:    Patient ID: Shannon Marks, female    DOB: March 25, 1981, 42 y.o.   MRN: [id number]  Chief Complaint  Patient presents with   Exposure to STD    Possible exposure to STD and would like testing done currently on menses    Patient is in today for possible STI exposure. She denies any symptoms at this time. She request STI testing including HSV IgG. She was advised about non specificity of HSV test result. She verbalized understaing.  Outpatient Medications Prior to Visit  Medication Sig   albuterol (VENTOLIN HFA) 108 (90 Base) MCG/ACT inhaler Inhale 1 puff into the lungs every 6 (six) hours as needed for wheezing or shortness of breath. Need office visit for additional refill   amoxicillin-clavulanate (AUGMENTIN) 875-125 MG tablet Take 1 tablet by mouth 2 (two) times daily.   fluticasone (FLONASE) 50 MCG/ACT nasal spray Place 2 sprays into both nostrils daily.   promethazine-dextromethorphan (PROMETHAZINE-DM) 6.25-15 MG/5ML syrup Take 5 mLs by mouth 3 (three) times daily as needed for cough.   drospirenone-ethinyl estradiol (YAZ) 3-0.02 MG tablet Take 1 tablet by mouth daily. (Patient not taking: Reported on 02/09/2023)   methylPREDNISolone (MEDROL DOSEPAK) 4 MG TBPK tablet Take as directed on package (Patient not taking: Reported on 02/09/2023)   metroNIDAZOLE (METROGEL VAGINAL) 0.75 % vaginal gel Place 1 Applicatorful vaginally at bedtime. (Patient not taking: Reported on 02/09/2023)   oxymetazoline (AFRIN NASAL SPRAY) 0.05 % nasal spray Place 1 spray into both nostrils 2 (two) times daily. (Patient not taking: Reported on 02/09/2023)   No facility-administered medications prior to visit.    Reviewed past medical and social history.  Review of Systems Per HPI     Objective:    Physical Exam Vitals and nursing note reviewed.  Constitutional:      General: She is not in acute distress. Cardiovascular:     Rate and Rhythm: Normal rate.      Pulses: Normal pulses.  Pulmonary:     Effort: Pulmonary effort is normal.  Neurological:     Mental Status: She is oriented to person, place, and time.  Psychiatric:        Attention and Perception: Attention normal.        Mood and Affect: Mood is anxious.     BP 120/80   Pulse 75   Temp 98.3 F (36.8 C) (Temporal)   Ht 5\' 2"  (1.575 m)   Wt 137 lb 6.4 oz (62.3 kg)   LMP 02/06/2023   SpO2 99%   BMI 25.13 kg/m    No results found for any visits on 02/09/23.      Assessment & Plan:   Problem List Items Addressed This Visit   None Visit Diagnoses     Possible exposure to sexually transmitted infection    -  Primary   Relevant Orders   Urine cytology ancillary only   Hepatitis C antibody   Hep B Core Ab W/Reflex   HIV Antibody (routine testing w rflx)   RPR   HSV 2 antibody, IgG   HSV 1 antibody, IgG   Encounter for screening for viral disease       Relevant Orders   Urine cytology ancillary only   Hepatitis C antibody   Hep B Core Ab W/Reflex   HIV Antibody (routine testing w rflx)   RPR   HSV 2 antibody, IgG   HSV 1 antibody, IgG      No orders  of the defined types were placed in this encounter.  Return if symptoms worsen or fail to improve.    Alysia Penna, NP

## 2023-02-10 LAB — URINE CYTOLOGY ANCILLARY ONLY
Chlamydia: NEGATIVE
Comment: NEGATIVE
Comment: NEGATIVE
Comment: NORMAL
Neisseria Gonorrhea: NEGATIVE
Trichomonas: NEGATIVE

## 2023-02-10 LAB — HSV 2 ANTIBODY, IGG: HSV 2 Glycoprotein G Ab, IgG: <0.9 {index}

## 2023-02-10 LAB — HIV ANTIBODY (ROUTINE TESTING W REFLEX): HIV 1&2 Ab, 4th Generation: NONREACTIVE

## 2023-02-10 LAB — HSV 1 ANTIBODY, IGG: HSV 1 Glycoprotein G Ab, IgG: >58 {index} — ABNORMAL HIGH

## 2023-02-10 LAB — HEPATITIS C ANTIBODY: Hepatitis C Ab: NONREACTIVE

## 2023-02-10 LAB — RPR: RPR Ser Ql: NONREACTIVE

## 2023-02-10 LAB — HEPATITIS B CORE AB W/REFLEX: Hep B Core Total Ab: NEGATIVE

## 2023-02-13 ENCOUNTER — Telehealth: Payer: Self-pay | Admitting: Nurse Practitioner

## 2023-02-13 ENCOUNTER — Encounter: Payer: Self-pay | Admitting: Nurse Practitioner

## 2023-02-13 DIAGNOSIS — B009 Herpesviral infection, unspecified: Secondary | ICD-10-CM | POA: Insufficient documentation

## 2023-02-13 NOTE — Telephone Encounter (Signed)
Returned call to patient,  see result note

## 2023-02-13 NOTE — Telephone Encounter (Signed)
Pt returned call to hear lab results and has questions about 1 of the results she has seen on mychart.

## 2023-02-13 NOTE — Telephone Encounter (Signed)
See result note.  

## 2023-02-17 ENCOUNTER — Ambulatory Visit: Payer: 59 | Admitting: Nurse Practitioner

## 2023-02-18 ENCOUNTER — Encounter (HOSPITAL_BASED_OUTPATIENT_CLINIC_OR_DEPARTMENT_OTHER): Payer: Self-pay | Admitting: Emergency Medicine

## 2023-02-18 ENCOUNTER — Emergency Department (HOSPITAL_BASED_OUTPATIENT_CLINIC_OR_DEPARTMENT_OTHER)
Admission: EM | Admit: 2023-02-18 | Discharge: 2023-02-18 | Disposition: A | Discharge status: Home / Self Care | Payer: 59 | Attending: Emergency Medicine | Admitting: Emergency Medicine

## 2023-02-18 ENCOUNTER — Other Ambulatory Visit: Payer: Self-pay

## 2023-02-18 ENCOUNTER — Emergency Department (HOSPITAL_BASED_OUTPATIENT_CLINIC_OR_DEPARTMENT_OTHER): Payer: 59

## 2023-02-18 DIAGNOSIS — R519 Headache, unspecified: Secondary | ICD-10-CM | POA: Diagnosis present

## 2023-02-18 LAB — CBC WITH DIFFERENTIAL/PLATELET
Abs Immature Granulocytes: 0.03 10*3/uL (ref 0.00–0.07)
Basophils Absolute: 0 10*3/uL (ref 0.0–0.1)
Basophils Relative: 1 %
Eosinophils Absolute: 0 10*3/uL (ref 0.0–0.5)
Eosinophils Relative: 0 %
HCT: 37.2 % (ref 36.0–46.0)
Hemoglobin: 12.5 g/dL (ref 12.0–15.0)
Immature Granulocytes: 0 %
Lymphocytes Relative: 25 %
Lymphs Abs: 2.2 10*3/uL (ref 0.7–4.0)
MCH: 30.4 pg (ref 26.0–34.0)
MCHC: 33.6 g/dL (ref 30.0–36.0)
MCV: 90.5 fL (ref 80.0–100.0)
Monocytes Absolute: 0.4 10*3/uL (ref 0.1–1.0)
Monocytes Relative: 4 %
Neutro Abs: 6.2 10*3/uL (ref 1.7–7.7)
Neutrophils Relative %: 70 %
Platelets: 246 10*3/uL (ref 150–400)
RBC: 4.11 MIL/uL (ref 3.87–5.11)
RDW: 13.7 % (ref 11.5–15.5)
WBC: 8.9 10*3/uL (ref 4.0–10.5)
nRBC: 0 % (ref 0.0–0.2)

## 2023-02-18 LAB — BASIC METABOLIC PANEL
Anion gap: 11 (ref 5–15)
BUN: 10 mg/dL (ref 6–20)
CO2: 23 mmol/L (ref 22–32)
Calcium: 9.2 mg/dL (ref 8.9–10.3)
Chloride: 100 mmol/L (ref 98–111)
Creatinine, Ser: 0.8 mg/dL (ref 0.44–1.00)
GFR, Estimated: >60 mL/min (ref >60)
Glucose, Bld: 102 mg/dL — ABNORMAL HIGH (ref 70–99)
Potassium: 3.6 mmol/L (ref 3.5–5.1)
Sodium: 134 mmol/L — ABNORMAL LOW (ref 135–145)

## 2023-02-18 LAB — HCG, QUANTITATIVE, PREGNANCY: hCG, Beta Chain, Quant, S: <1 m[IU]/mL (ref <5)

## 2023-02-18 MED ORDER — LACTATED RINGERS IV BOLUS
1000.0000 mL | Freq: Once | INTRAVENOUS | Status: AC
  Administered 2023-02-18: 1000 mL via INTRAVENOUS

## 2023-02-18 MED ORDER — ACETAMINOPHEN 500 MG PO TABS
1000.0000 mg | ORAL_TABLET | Freq: Once | ORAL | Status: AC
  Administered 2023-02-18: 1000 mg via ORAL
  Filled 2023-02-18: qty 2

## 2023-02-18 MED ORDER — FAMOTIDINE 20 MG PO TABS
20.0000 mg | ORAL_TABLET | Freq: Once | ORAL | Status: AC
  Administered 2023-02-18: 20 mg via ORAL
  Filled 2023-02-18: qty 1

## 2023-02-18 MED ORDER — PROCHLORPERAZINE EDISYLATE 10 MG/2ML IJ SOLN
10.0000 mg | Freq: Once | INTRAMUSCULAR | Status: AC
  Administered 2023-02-18: 10 mg via INTRAVENOUS
  Filled 2023-02-18: qty 2

## 2023-02-18 MED ORDER — KETOROLAC TROMETHAMINE 15 MG/ML IJ SOLN
15.0000 mg | Freq: Once | INTRAMUSCULAR | Status: AC
  Administered 2023-02-18: 15 mg via INTRAVENOUS
  Filled 2023-02-18: qty 1

## 2023-02-18 MED ORDER — BUTALBITAL-APAP-CAFFEINE 50-325-40 MG PO TABS: 1.0000 | ORAL_TABLET | Freq: Four times a day (QID) | ORAL | 0 refills | Status: DC | PRN

## 2023-02-18 MED ORDER — DIPHENHYDRAMINE HCL 50 MG/ML IJ SOLN
25.0000 mg | Freq: Once | INTRAMUSCULAR | Status: AC
  Administered 2023-02-18: 25 mg via INTRAVENOUS
  Filled 2023-02-18: qty 1

## 2023-02-18 NOTE — ED Triage Notes (Addendum)
Pt c/o HA today that OTC meds are not helping; also reports she vomited blood-tinged emesis x 1; c/o "acid feeling" in middle chest

## 2023-02-18 NOTE — ED Provider Notes (Addendum)
[place] [name]   CSN: [id number] Arrival date & time: 02/18/23  1530     History Chief Complaint  Patient presents with   Headache    HPI Shannon Marks is a 42 y.o. female presenting for headache.  She has a history of headaches otherwise healthy.  States that last night she was at a social event and took brownies from a friend.  She developed a headache a few hours after taking them and today has had frequent vomiting and persistent headache.  States this is mildly worse than her normal headaches.  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 She is ambulatory tolerating p.o. intake at this time vomiting appears to have improved.  She did vomit 3 times in rapid succession earlier today which triggered the headache and the final episode of emesis had small traces of blood..   Patient's recorded medical, surgical, social, medication list and allergies were reviewed in the Snapshot window as part of the initial history.   Review of Systems   Review of Systems  Constitutional:  Negative for chills and fever.  HENT:  Negative for ear pain and sore throat.   Eyes:  Negative for pain and visual disturbance.  Respiratory:  Negative for cough and shortness of breath.   Cardiovascular:  Negative for chest pain and palpitations.  Gastrointestinal:  Positive for vomiting. Negative for abdominal pain.  Genitourinary:  Negative for dysuria and hematuria.  Musculoskeletal:  Negative for arthralgias and back pain.  Skin:  Negative for color change and rash.  Neurological:  Positive for headaches. Negative for seizures and syncope.  All other systems reviewed and are negative.   Physical Exam Updated Vital Signs BP 104/71 (BP Location: Left Arm)   Pulse 67   Temp 98.5 F (36.9 C) (Oral)   Resp 14   Ht 5\' 2"  (1.575 m)   Wt 62.1 kg   LMP 02/06/2023    SpO2 96%   BMI 25.06 kg/m  Physical Exam Vitals and nursing note reviewed.  Constitutional:      General: She is not in acute distress.    Appearance: She is well-developed.  HENT:     Head: Normocephalic and atraumatic.  Eyes:     Conjunctiva/sclera: Conjunctivae normal.  Cardiovascular:     Rate and Rhythm: Normal rate and regular rhythm.     Heart sounds: No murmur heard. Pulmonary:     Effort: Pulmonary effort is normal. No respiratory distress.     Breath sounds: Normal breath sounds.  Abdominal:     Palpations: Abdomen is soft.     Tenderness: There is no abdominal tenderness.  Musculoskeletal:        General: No swelling.     Cervical back: Normal range of motion and neck supple. No rigidity.  Skin:    General: Skin is warm and dry.     Capillary Refill: Capillary refill takes less than 2 seconds.  Neurological:     General: No focal deficit present.     Mental Status: She is alert and oriented to person, place, and time. Mental status is at baseline.     Cranial Nerves: No cranial nerve deficit.     Motor: No weakness.     Coordination: Coordination normal.  Psychiatric:        Mood and Affect: Mood normal.  ED Course/ Medical Decision Making/ A&P    Procedures Procedures   Medications Ordered in ED Medications  famotidine (PEPCID) tablet 20 mg (20 mg Oral Given 02/18/23 1604)  acetaminophen (TYLENOL) tablet 1,000 mg (1,000 mg Oral Given 02/18/23 1604)  ketorolac (TORADOL) 15 MG/ML injection 15 mg (15 mg Intravenous Given 02/18/23 1606)  prochlorperazine (COMPAZINE) injection 10 mg (10 mg Intravenous Given 02/18/23 1606)  diphenhydrAMINE (BENADRYL) injection 25 mg (25 mg Intravenous Given 02/18/23 1606)  lactated ringers bolus 1,000 mL (0 mLs Intravenous Stopped 02/18/23 1859)   Medical Decision Making:   Shannon Marks is a 43 y.o. female who presented to the ED today with headache detailed above.    Patient placed on continuous vitals and  telemetry monitoring while in ED which was reviewed periodically.   Complete initial physical exam performed, notably the patient  was HDS in NAD.    Reviewed and confirmed nursing documentation for past medical history, family history, social history.    Initial Assessment:   With the patient's presentation of headache, most likely diagnosis is tension type headaches vs atypical migraines. Other diagnoses were considered including (but not limited to) intracranial mass, intracranial hemorrhage, intracranial infection including meningitis vs encephalitis, GCA, trigeminal neuralgia. These are considered less likely due to history of present illness and physical exam findings.    This is most consistent with an acute life/limb threatening illness complicated by underlying chronic conditions.   Timeline and slow onset is not consistent with SAH/ICH   Age and description of pain is not consistent with GCA   Lack of fever,meningismus is not consistent with meningitis/encephalitis.  Viral meningitis might be on the differential given the recent HSV diagnosis but this seems grossly inconsistent with her current presentation.  Will reassess if she is not improving with treatment as below  Initial Plan:  Will initiate treatment with Compazine/Benardryll/NSAIDS/Tylenol for treatment of nonspecific headache  Screening labs including CBC and Metabolic panel to evaluate for infectious or metabolic etiology of disease.  Urinalysis with reflex culture ordered to evaluate for UTI or relevant urologic/nephrologic pathology.  CTH to evaluate for structural IC etiology  Objective evaluation as below reviewed   Initial Study Results:   Laboratory  All laboratory results reviewed without evidence of clinically relevant pathology.    EKG EKG was reviewed independently. Rate, rhythm, axis, intervals all examined and without medically relevant abnormality. ST segments without concerns for elevations.     Radiology:  All images reviewed independently. Agree with radiology report at this time.   CT HEAD WO CONTRAST ( )  Final Result    DG Chest Portable 1 View  Final Result      Final Assessment and Plan:   Patient's history of present illness and physical exam findings are most consistent with nonspecific etiology.  Headache resolved on my serial assessment otherwise ambulatory tolerating p.o. intake.  Given resolution of pain I believe patient stable for outpatient care management will send her medication in case needed for rebound headache tomorrow strict return precautions reinforced  Disposition:  I have considered need for hospitalization, however, considering all of the above, I believe this patient is stable for discharge at this time.  Patient/family educated about specific return precautions for given chief complaint and symptoms.  Patient/family educated about follow-up with PCP.      Patient/family expressed understanding of return precautions and need for follow-up. Patient spoken to regarding all imaging and laboratory results and appropriate follow up for these results. All education provided in  verbal form with additional information in written form. Time was allowed for answering of patient questions. Patient discharged.    Emergency Department Medication Summary:   Medications  famotidine (PEPCID) tablet 20 mg (20 mg Oral Given 02/18/23 1604)  acetaminophen (TYLENOL) tablet 1,000 mg (1,000 mg Oral Given 02/18/23 1604)  ketorolac (TORADOL) 15 MG/ML injection 15 mg (15 mg Intravenous Given 02/18/23 1606)  prochlorperazine (COMPAZINE) injection 10 mg (10 mg Intravenous Given 02/18/23 1606)  diphenhydrAMINE (BENADRYL) injection 25 mg (25 mg Intravenous Given 02/18/23 1606)  lactated ringers bolus 1,000 mL (0 mLs Intravenous Stopped 02/18/23 1859)      Clinical Impression:  1. Acute nonintractable headache, unspecified headache type      Discharge   Final  Clinical Impression(s) / ED Diagnoses Final diagnoses:  Acute nonintractable headache, unspecified headache type    Rx / DC Orders ED Discharge Orders          Ordered    butalbital-acetaminophen-caffeine (FIORICET) 50-325-40 MG tablet  Every 6 hours PRN        02/18/23 1918              Glyn Ade, MD 02/18/23 1919    Glyn Ade, MD 02/18/23 1919

## 2023-02-26 ENCOUNTER — Other Ambulatory Visit: Payer: Self-pay | Admitting: Nurse Practitioner

## 2023-02-26 DIAGNOSIS — J069 Acute upper respiratory infection, unspecified: Secondary | ICD-10-CM

## 2023-03-17 ENCOUNTER — Encounter: Payer: Self-pay | Admitting: Nurse Practitioner

## 2023-03-17 ENCOUNTER — Ambulatory Visit (INDEPENDENT_AMBULATORY_CARE_PROVIDER_SITE_OTHER): Payer: 59 | Admitting: Nurse Practitioner

## 2023-03-17 VITALS — BP 120/77 | HR 64 | Temp 98.5°F | Resp 18 | Ht 62.5 in | Wt 133.4 lb

## 2023-03-17 DIAGNOSIS — Z0000 Encounter for general adult medical examination without abnormal findings: Secondary | ICD-10-CM

## 2023-03-17 NOTE — Progress Notes (Signed)
Complete physical exam  Patient: ROSSELLA Marks   DOB: 01/22/1981   42 y.o. Female  MRN: [id number] Visit Date: 03/17/2023  Subjective:    Chief Complaint  Patient presents with   Annual Exam    PT is here for annual exam    ALAISA Marks is a 42 y.o. female who presents today for a complete physical exam. She reports consuming a general diet.  Daily walking   She generally feels well. She reports sleeping well. She does not have additional problems to discuss today.  Vision:Yes Dental:Yes STD Screen:No  BP Readings from Last 3 Encounters:  03/17/23 120/77  02/18/23 104/71  02/09/23 120/80   Wt Readings from Last 3 Encounters:  03/17/23 133 lb 6.4 oz (60.5 kg)  02/18/23 137 lb (62.1 kg)  02/09/23 137 lb 6.4 oz (62.3 kg)   Most recent fall risk assessment:    03/17/2023    9:24 AM  Fall Risk   Falls in the past year? 0  Number falls in past yr: 0  Injury with Fall? 0  Risk for fall due to : No Fall Risks  Follow up Falls evaluation completed   Depression screen:Yes - No Depression Most recent depression screenings:    03/17/2023    9:24 AM 02/09/2023   10:47 AM  PHQ 2/9 Scores  PHQ - 2 Score 0 0  PHQ- 9 Score 0     HPI  No problem-specific Assessment & Plan notes found for this encounter.   Past Medical History:  Diagnosis Date   Marital conflict 06/25/2021   Past Surgical History:  Procedure Laterality Date   TUBAL LIGATION     Social History   Socioeconomic History   Marital status: Single    Spouse name: Not on file   Number of children: Not on file   Years of education: Not on file   Highest education level: GED or equivalent  Occupational History   Not on file  Tobacco Use   Smoking status: Never   Smokeless tobacco: Never  Vaping Use   Vaping status: Never Used  Substance and Sexual Activity   Alcohol use: No   Drug use: No   Sexual activity: Yes    Birth control/protection: Surgical  Other Topics Concern   Not on file   Social History Narrative   Not on file   Social Determinants of Health   Financial Resource Strain: Low Risk  (03/13/2023)   Overall Financial Resource Strain (CARDIA)    Difficulty of Paying Living Expenses: Not hard at all  Food Insecurity: No Food Insecurity (03/13/2023)   Hunger Vital Sign    Worried About Running Out of Food in the Last Year: Never true    Ran Out of Food in the Last Year: Never true  Transportation Needs: No Transportation Needs (03/13/2023)   PRAPARE - Administrator, Civil Service (Medical): No    Lack of Transportation (Non-Medical): No  Physical Activity: Sufficiently Active (03/13/2023)   Exercise Vital Sign    Days of Exercise per Week: 7 days    Minutes of Exercise per Session: 30 min  Stress: No Stress Concern Present (03/13/2023)   [employer] of Occupational Health - Occupational Stress Questionnaire    Feeling of Stress : Not at all  Social Connections: Moderately Isolated (03/13/2023)   Social Connection and Isolation Panel [NHANES]    Frequency of Communication with Friends and Family: More than three times a week  Frequency of Social Gatherings with Friends and Family: More than three times a week    Attends Religious Services: More than 4 times per year    Active Member of [employer] or Organizations: No    Attends Engineer, structural: Not on file    Marital Status: Divorced  Intimate Partner Violence: Not on file   No family status information on file.   Family History  Family history unknown: Yes   No Known Allergies  Patient Care Team: Jarell Mcewen, Bonna Gains, NP as PCP - General (Internal Medicine)   Medications: Outpatient Medications Prior to Visit  Medication Sig   albuterol (VENTOLIN HFA) 108 (90 Base) MCG/ACT inhaler Inhale 1 puff into the lungs every 6 (six) hours as needed for wheezing or shortness of breath. Need office visit for additional refill   butalbital-acetaminophen-caffeine (FIORICET)  50-325-40 MG tablet Take 1-2 tablets by mouth every 6 (six) hours as needed for up to 365 doses for headache.   [DISCONTINUED] amoxicillin-clavulanate (AUGMENTIN) 875-125 MG tablet Take 1 tablet by mouth 2 (two) times daily. (Patient not taking: Reported on 03/17/2023)   [DISCONTINUED] drospirenone-ethinyl estradiol (YAZ) 3-0.02 MG tablet Take 1 tablet by mouth daily. (Patient not taking: Reported on 03/17/2023)   [DISCONTINUED] fluticasone (FLONASE) 50 MCG/ACT nasal spray Place 2 sprays into both nostrils daily. (Patient not taking: Reported on 03/17/2023)   [DISCONTINUED] methylPREDNISolone (MEDROL DOSEPAK) 4 MG TBPK tablet Take as directed on package (Patient not taking: Reported on 03/17/2023)   [DISCONTINUED] metroNIDAZOLE (METROGEL VAGINAL) 0.75 % vaginal gel Place 1 Applicatorful vaginally at bedtime. (Patient not taking: Reported on 02/09/2023)   [DISCONTINUED] oxymetazoline (AFRIN NASAL SPRAY) 0.05 % nasal spray Place 1 spray into both nostrils 2 (two) times daily. (Patient not taking: Reported on 03/17/2023)   [DISCONTINUED] promethazine-dextromethorphan (PROMETHAZINE-DM) 6.25-15 MG/5ML syrup Take 5 mLs by mouth 3 (three) times daily as needed for cough. (Patient not taking: Reported on 03/17/2023)   No facility-administered medications prior to visit.    Review of Systems  Constitutional:  Negative for activity change, appetite change and unexpected weight change.  Respiratory: Negative.    Cardiovascular: Negative.   Gastrointestinal: Negative.   Endocrine: Negative for cold intolerance and heat intolerance.  Genitourinary: Negative.   Musculoskeletal: Negative.   Skin: Negative.   Neurological: Negative.   Hematological: Negative.   Psychiatric/Behavioral:  Negative for behavioral problems, decreased concentration, dysphoric mood, hallucinations, self-injury, sleep disturbance and suicidal ideas. The patient is not nervous/anxious.    Last CBC Lab Results  Component Value Date    WBC 8.9 02/18/2023   HGB 12.5 02/18/2023   HCT 37.2 02/18/2023   MCV 90.5 02/18/2023   MCH 30.4 02/18/2023   RDW 13.7 02/18/2023   PLT 246 02/18/2023   Last metabolic panel Lab Results  Component Value Date   GLUCOSE 102 (H) 02/18/2023   NA 134 (L) 02/18/2023   K 3.6 02/18/2023   CL 100 02/18/2023   CO2 23 02/18/2023   BUN 10 02/18/2023   CREATININE 0.80 02/18/2023   GFRNONAA >60 02/18/2023   CALCIUM 9.2 02/18/2023   PROT 6.7 06/25/2021   ALBUMIN 4.3 06/25/2021   BILITOT 0.3 06/25/2021   ALKPHOS 48 06/25/2021   AST 38 (H) 06/25/2021   ALT 31 06/25/2021   ANIONGAP 11 02/18/2023   Last lipids Lab Results  Component Value Date   CHOL 162 06/25/2021   HDL 50.00 06/25/2021   LDLCALC 97 06/25/2021   TRIG 76.0 06/25/2021   CHOLHDL 3 06/25/2021  Last thyroid functions Lab Results  Component Value Date   TSH 2.05 06/25/2021      Objective:  BP 120/77 (BP Location: Left Arm, Patient Position: Sitting, Cuff Size: Normal)   Pulse 64   Temp 98.5 F (36.9 C) (Temporal)   Resp 18   Ht 5' 2.5" (1.588 m)   Wt 133 lb 6.4 oz (60.5 kg)   LMP 02/12/2023 (Exact Date)   SpO2 99%   BMI 24.01 kg/m     Physical Exam Vitals and nursing note reviewed.  Constitutional:      General: She is not in acute distress. HENT:     Right Ear: Tympanic membrane, ear canal and external ear normal.     Left Ear: Tympanic membrane, ear canal and external ear normal.     Nose: Nose normal.  Eyes:     Extraocular Movements: Extraocular movements intact.     Conjunctiva/sclera: Conjunctivae normal.     Pupils: Pupils are equal, round, and reactive to light.  Neck:     Thyroid: No thyroid mass, thyromegaly or thyroid tenderness.  Cardiovascular:     Rate and Rhythm: Normal rate and regular rhythm.     Pulses: Normal pulses.     Heart sounds: Normal heart sounds.  Pulmonary:     Effort: Pulmonary effort is normal.     Breath sounds: Normal breath sounds.  Abdominal:     General:  Bowel sounds are normal.     Palpations: Abdomen is soft.  Musculoskeletal:        General: Normal range of motion.     Cervical back: Normal range of motion and neck supple.     Right lower leg: No edema.     Left lower leg: No edema.  Lymphadenopathy:     Cervical: No cervical adenopathy.  Skin:    General: Skin is warm and dry.  Neurological:     Mental Status: She is alert and oriented to person, place, and time.     Cranial Nerves: No cranial nerve deficit.  Psychiatric:        Mood and Affect: Mood normal.        Behavior: Behavior normal.        Thought Content: Thought content normal.     No results found for any visits on 03/17/23.    Assessment & Plan:    Routine Health Maintenance and Physical Exam  Immunization History  Administered Date(s) Administered   Influenza,inj,Quad PF,6+ Mos 03/16/2020   Influenza-Unspecified 06/25/2021   Janssen (J&J) SARS-COV-2 Vaccination 07/12/2019, 05/04/2020   Tdap 03/16/2020   Health Maintenance  Topic Date Due   COVID-19 Vaccine (3 - 2023-24 season) 01/01/2023   INFLUENZA VACCINE  07/31/2023 (Originally 12/01/2022)   Cervical Cancer Screening (Pap smear)  08/05/2023   DTaP/Tdap/Td (2 - Td or Tdap) 03/16/2030   Hepatitis C Screening  Completed   HIV Screening  Completed   HPV VACCINES  Aged Out   Discussed health benefits of physical activity, and encouraged her to engage in regular exercise appropriate for her age and condition. Advised to schedule appointment for annual mammogram.  Problem List Items Addressed This Visit   None Visit Diagnoses     Preventative health care    -  Primary      Return in about 1 year (around 03/16/2024) for CPE (fasting).     Alysia Penna, NP

## 2023-03-17 NOTE — Patient Instructions (Addendum)
Schedule appointment for mammogram at [facility]. [phone] Continue Heart healthy diet and daily exercise.  Preventive Care 43-43 Years Old, Female Preventive care refers to lifestyle choices and visits with your health care provider that can promote health and wellness. Preventive care visits are also called wellness exams. What can I expect for my preventive care visit? Counseling Your health care provider may ask you questions about your: Medical history, including: Past medical problems. Family medical history. Pregnancy history. Current health, including: Menstrual cycle. Method of birth control. Emotional well-being. Home life and relationship well-being. Sexual activity and sexual health. Lifestyle, including: Alcohol, nicotine or tobacco, and drug use. Access to firearms. Diet, exercise, and sleep habits. Work and work Astronomer. Sunscreen use. Safety issues such as seatbelt and bike helmet use. Physical exam Your health care provider will check your: Height and weight. These may be used to calculate your BMI (body mass index). BMI is a measurement that tells if you are at a healthy weight. Waist circumference. This measures the distance around your waistline. This measurement also tells if you are at a healthy weight and may help predict your risk of certain diseases, such as type 2 diabetes and high blood pressure. Heart rate and blood pressure. Body temperature. Skin for abnormal spots. What immunizations do I need?  Vaccines are usually given at various ages, according to a schedule. Your health care provider will recommend vaccines for you based on your age, medical history, and lifestyle or other factors, such as travel or where you work. What tests do I need? Screening Your health care provider may recommend screening tests for certain conditions. This may include: Lipid and cholesterol levels. Diabetes screening. This is done by checking your  blood sugar (glucose) after you have not eaten for a while (fasting). Pelvic exam and Pap test. Hepatitis B test. Hepatitis C test. HIV (human immunodeficiency virus) test. STI (sexually transmitted infection) testing, if you are at risk. Lung cancer screening. Colorectal cancer screening. Mammogram. Talk with your health care provider about when you should start having regular mammograms. This may depend on whether you have a family history of breast cancer. BRCA-related cancer screening. This may be done if you have a family history of breast, ovarian, tubal, or peritoneal cancers. Bone density scan. This is done to screen for osteoporosis. Talk with your health care provider about your test results, treatment options, and if necessary, the need for more tests. Follow these instructions at home: Eating and drinking  Eat a diet that includes fresh fruits and vegetables, whole grains, lean protein, and low-fat dairy products. Take vitamin and mineral supplements as recommended by your health care provider. Do not drink alcohol if: Your health care provider tells you not to drink. You are pregnant, may be pregnant, or are planning to become pregnant. If you drink alcohol: Limit how much you have to 0-1 drink a day. Know how much alcohol is in your drink. In the [place] one drink equals one 12 oz bottle of beer (355 mL), one 5 oz glass of wine (148 mL), or one 1 oz glass of hard liquor (44 mL). Lifestyle Brush your teeth every morning and night with fluoride toothpaste. Floss one time each day. Exercise for at least 30 minutes 5 or more days each week. Do not use any products that contain nicotine or tobacco. These products include cigarettes, chewing tobacco, and vaping devices, such as e-cigarettes. If you need help quitting, ask your health care provider. Do not  use drugs. If you are sexually active, practice safe sex. Use a condom or other form of protection to prevent STIs. If you do  not wish to become pregnant, use a form of birth control. If you plan to become pregnant, see your health care provider for a prepregnancy visit. Take aspirin only as told by your health care provider. Make sure that you understand how much to take and what form to take. Work with your health care provider to find out whether it is safe and beneficial for you to take aspirin daily. Find healthy ways to manage stress, such as: Meditation, yoga, or listening to music. Journaling. Talking to a trusted person. Spending time with friends and family. Minimize exposure to UV radiation to reduce your risk of skin cancer. Safety Always wear your seat belt while driving or riding in a vehicle. Do not drive: If you have been drinking alcohol. Do not ride with someone who has been drinking. When you are tired or distracted. While texting. If you have been using any mind-altering substances or drugs. Wear a helmet and other protective equipment during sports activities. If you have firearms in your house, make sure you follow all gun safety procedures. Seek help if you have been physically or sexually abused. What's next? Visit your health care provider once a year for an annual wellness visit. Ask your health care provider how often you should have your eyes and teeth checked. Stay up to date on all vaccines. This information is not intended to replace advice given to you by your health care provider. Make sure you discuss any questions you have with your health care provider. Document Revised: 10/14/2020 Document Reviewed: 10/14/2020 Elsevier Patient Education  2024 [employer].

## 2023-03-22 ENCOUNTER — Ambulatory Visit (INDEPENDENT_AMBULATORY_CARE_PROVIDER_SITE_OTHER): Payer: 59 | Admitting: Internal Medicine

## 2023-03-22 ENCOUNTER — Encounter: Payer: Self-pay | Admitting: Internal Medicine

## 2023-03-22 VITALS — BP 110/70 | HR 74 | Temp 99.0°F | Ht 62.56 in | Wt 135.0 lb

## 2023-03-22 DIAGNOSIS — J069 Acute upper respiratory infection, unspecified: Secondary | ICD-10-CM

## 2023-03-22 DIAGNOSIS — H66001 Acute suppurative otitis media without spontaneous rupture of ear drum, right ear: Secondary | ICD-10-CM

## 2023-03-22 LAB — POCT RAPID STREP A (OFFICE): Rapid Strep A Screen: NEGATIVE

## 2023-03-22 LAB — POC INFLUENZA A&B (BINAX/QUICKVUE)
Influenza A, POC: NEGATIVE
Influenza B, POC: NEGATIVE

## 2023-03-22 LAB — POC COVID19 BINAXNOW: SARS Coronavirus 2 Ag: NEGATIVE

## 2023-03-22 MED ORDER — AMOXICILLIN-POT CLAVULANATE 875-125 MG PO TABS: 1.0000 | ORAL_TABLET | Freq: Two times a day (BID) | ORAL | 0 refills | Status: AC

## 2023-03-22 NOTE — Progress Notes (Signed)
[facility] [address] [place] [address] Dept: [phone] Dept Fax: [phone]  Acute Care Office Visit  Subjective:   Shannon Marks [birth year] 03/22/2023  Chief Complaint  Patient presents with   Sore Throat   Cough    Started yesterday     HPI: Discussed the use of AI scribe software for clinical note transcription with the patient, who gave verbal consent to proceed.  History of Present Illness   The patient, with a history of strep throat and AOM of left ear on 02/03/23, presents with a one-day history of a sore throat and severe bilateral ear pain. She also reports a fever of 101 degrees, body aches, chills, cough, and sinus congestion. She denies taking any medication for her symptoms. [place] at [employer] [place] people. She also reports a history of asthma, which is exacerbated when she is ill, and she uses an albuterol inhaler as needed.     The following portions of the patient's history were reviewed and updated as appropriate: past medical history, past surgical history, family history, social history, allergies, medications, and problem list.   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 Need office visit for additional refill, Disp: 6.7 g, Rfl: 0   amoxicillin-clavulanate (AUGMENTIN) 875-125 MG tablet, Take 1 tablet by mouth 2 (two) times  daily for 7 days., Disp: 14 tablet, Rfl: 0   butalbital-acetaminophen-caffeine (FIORICET) 50-325-40 MG tablet, Take 1-2 tablets by mouth every 6 (six) hours as needed for up to 365 doses for headache. (Patient not taking: Reported on 03/22/2023), Disp: 10 tablet, Rfl: 0 No Known Allergies   ROS: A complete ROS was performed with pertinent positives/negatives noted in the HPI. The remainder of the ROS are negative.    Objective:   Today's Vitals   03/22/23 0856  BP: 110/70  Pulse: 74  Temp: 99 F (37.2 C)  TempSrc: Oral  SpO2: 98%  Weight: 135 lb (61.2 kg)  Height: 5' 2.56" (1.589 m)    GENERAL: Well-appearing, in NAD. Well nourished.  SKIN: Pink, warm and dry. No rash, lesion, ulceration, or ecchymoses. HEENT:    HEAD: Normocephalic, non-traumatic.  EYES: Conjunctive pink without exudate. PERRL, EOMI.  EARS: External ear w/o redness, swelling, masses, or lesions. EAC clear. Left TM intact, translucent w/o bulging, appropriate landmarks visualized. Right TM erythematous and bulging. No perforation.  NOSE: Septum midline w/o deformity. Nares patent, mucosa pink and inflamed w/o drainage. (+) maxillary sinus tenderness.  THROAT: Uvula midline. Oropharynx erythematous. Tonsils non-inflamed w/o exudate. Mucus membranes pink and moist.   NECK: Trachea midline. Full ROM w/o pain or tenderness. enlarged submandibular lymph nodes bilaterally.   RESPIRATORY: Chest wall symmetrical. Respirations even and non-labored. Breath sounds clear to auscultation bilaterally. (+)dry cough CARDIAC: S1, S2 present, regular rate and rhythm. Peripheral pulses 2+ bilaterally.  EXTREMITIES: Without clubbing, cyanosis, or edema.  NEUROLOGIC:  Steady,  even gait.  PSYCH/MENTAL STATUS: Alert, oriented x 3. Cooperative, appropriate mood and affect.    Results for orders placed or performed in visit on 03/22/23  POC COVID-19  Result Value Ref Range   SARS Coronavirus 2 Ag Negative Negative  POC Influenza A&B  (Binax test)  Result Value Ref Range   Influenza A, POC Negative Negative   Influenza B, POC Negative Negative  POCT rapid strep A  Result Value Ref Range   Rapid Strep A Screen Negative Negative      Assessment & Plan:  Assessment and Plan    Otitis Media Right ear infection with pain and redness noted on examination. -Prescribe Augmentin BID for 7 days.  Upper Respiratory Infection Symptoms of sore throat, cough with phlegm, fever, body aches, and chills. Negative for COVID, flu, and strep. -Advise rest and hydration. -Recommend Tylenol as needed for fever or body aches. -Advise use of albuterol inhaler as needed for shortness of breath. -Recommend over-the-counter Mucinex DM or Robitussin DM for cough. -Advise Flonase and Astepro for nasal and sinus congestion. -Recommend sinus rinse such as with nedipot or neilmed. -Advise saltwater gargles and throat lozenges for sore throat.  Follow up if no improvement.      Meds ordered this encounter  Medications   amoxicillin-clavulanate (AUGMENTIN) 875-125 MG tablet    Sig: Take 1 tablet by mouth 2 (two) times daily for 7 days.    Dispense:  14 tablet    Refill:  0    Order Specific Question:   Supervising Provider    Answer:   Garnette Gunner [[id number]]   Orders Placed This Encounter  Procedures   POC COVID-19    Order Specific Question:   Previously tested for COVID-19    Answer:   Unknown    Order Specific Question:   Resident in a congregate (group) care setting    Answer:   No    Order Specific Question:   Employed in healthcare setting    Answer:   Unknown    Order Specific Question:   Pregnant    Answer:   No   POC Influenza A&B (Binax test)   POCT rapid strep A   Lab Orders         POC COVID-19         POC Influenza A&B (Binax test)         POCT rapid strep A     No images are attached to the encounter or orders placed in the encounter.  Return if symptoms worsen or fail to improve.   Salvatore Decent,  FNP

## 2023-03-22 NOTE — Patient Instructions (Addendum)
Rest, drink plenty of fluids.  Tylenol for fever, body aches.   Use albuterol as needed for shortness of breath   For cough: Take Mucinex DM or Robitussin-DM over-the-counter.  Follow the instructions in the box.  For nasal and sinus congestion: Use over-the-counter Flonase: 2 nasal sprays on each side of the nose in the morning until you feel better  Use over-the-counter Astepro 2 nasal sprays on each side of the nose twice daily until better  Over the counter Sinus Rinses (Neilmed sinus rinse)   For Sore throat:  Salt water gargles  Throat lozenges   If you have been prescribed an antibiotic, take as prescribed.  Call if not gradually better over the next  10 days  Call anytime if the symptoms are severe, you have high fever, short of breath, chest pain

## 2023-03-24 ENCOUNTER — Telehealth: Payer: Self-pay | Admitting: Nurse Practitioner

## 2023-03-24 DIAGNOSIS — J020 Streptococcal pharyngitis: Secondary | ICD-10-CM

## 2023-03-24 MED ORDER — METHYLPREDNISOLONE 4 MG PO TBPK: ORAL_TABLET | ORAL | 0 refills | Status: DC

## 2023-03-24 NOTE — Telephone Encounter (Signed)
Pt called to say her cough and sore throat is getting worse. She wants to know if another prescription can be called in for her to go pick up at the pharmacy considering the fact that she was just seen on Wednesday 03/23/23. She wants the nurse to give her a call back at [phone] to know if sent.

## 2023-03-24 NOTE — Telephone Encounter (Signed)
Please advise in Ashley's absence

## 2023-03-24 NOTE — Addendum Note (Signed)
Addended by: Alysia Penna L on: 03/24/2023 01:56 PM   Modules accepted: Orders

## 2023-03-24 NOTE — Telephone Encounter (Signed)
Spoke to patient and informed her that changing medication course is not recommend; antibiotic of prednisone was sent to pharmacy with referral placed for ENT. PT verbalized understanding and will follow up within 1-2 weeks if symptoms don't improve.

## 2023-03-24 NOTE — Telephone Encounter (Signed)
Pt said she left a my chart message earlier today and have not heard a answer back . Please give the pt a call back

## 2023-04-28 ENCOUNTER — Institutional Professional Consult (permissible substitution) (INDEPENDENT_AMBULATORY_CARE_PROVIDER_SITE_OTHER): Payer: 59

## 2023-05-09 ENCOUNTER — Encounter (HOSPITAL_BASED_OUTPATIENT_CLINIC_OR_DEPARTMENT_OTHER): Payer: Self-pay | Admitting: Urology

## 2023-05-09 ENCOUNTER — Emergency Department (HOSPITAL_BASED_OUTPATIENT_CLINIC_OR_DEPARTMENT_OTHER)
Admission: EM | Admit: 2023-05-09 | Discharge: 2023-05-09 | Disposition: A | Discharge status: Home / Self Care | Payer: 59 | Attending: Emergency Medicine | Admitting: Emergency Medicine

## 2023-05-09 ENCOUNTER — Emergency Department (HOSPITAL_BASED_OUTPATIENT_CLINIC_OR_DEPARTMENT_OTHER): Payer: 59

## 2023-05-09 DIAGNOSIS — R0789 Other chest pain: Secondary | ICD-10-CM | POA: Diagnosis present

## 2023-05-09 LAB — TROPONIN I (HIGH SENSITIVITY)
Troponin I (High Sensitivity): <2 ng/L (ref <18)
Troponin I (High Sensitivity): <2 ng/L (ref <18)

## 2023-05-09 LAB — BASIC METABOLIC PANEL
Anion gap: 6 (ref 5–15)
BUN: 8 mg/dL (ref 6–20)
CO2: 22 mmol/L (ref 22–32)
Calcium: 8.7 mg/dL — ABNORMAL LOW (ref 8.9–10.3)
Chloride: 105 mmol/L (ref 98–111)
Creatinine, Ser: 0.63 mg/dL (ref 0.44–1.00)
GFR, Estimated: >60 mL/min (ref >60)
Glucose, Bld: 89 mg/dL (ref 70–99)
Potassium: 4.1 mmol/L (ref 3.5–5.1)
Sodium: 133 mmol/L — ABNORMAL LOW (ref 135–145)

## 2023-05-09 LAB — CBC
HCT: 37.3 % (ref 36.0–46.0)
Hemoglobin: 12.6 g/dL (ref 12.0–15.0)
MCH: 30.1 pg (ref 26.0–34.0)
MCHC: 33.8 g/dL (ref 30.0–36.0)
MCV: 89.2 fL (ref 80.0–100.0)
Platelets: 183 10*3/uL (ref 150–400)
RBC: 4.18 MIL/uL (ref 3.87–5.11)
RDW: 13.2 % (ref 11.5–15.5)
WBC: 5.6 10*3/uL (ref 4.0–10.5)
nRBC: 0 % (ref 0.0–0.2)

## 2023-05-09 MED ORDER — IBUPROFEN 400 MG PO TABS
600.0000 mg | ORAL_TABLET | Freq: Once | ORAL | Status: AC
  Administered 2023-05-09: 600 mg via ORAL
  Filled 2023-05-09: qty 1

## 2023-05-09 MED ORDER — IBUPROFEN 600 MG PO TABS: 600.0000 mg | ORAL_TABLET | Freq: Four times a day (QID) | ORAL | 0 refills | Status: DC | PRN

## 2023-05-09 MED ORDER — LIDOCAINE 5 % EX PTCH: 1.0000 | MEDICATED_PATCH | CUTANEOUS | 0 refills | Status: DC

## 2023-05-09 NOTE — ED Provider Notes (Signed)
  [name]   [name] Arrival date & time: 05/09/23  1253     History  Chief Complaint  Patient presents with   Chest Pain    Shannon Marks is a 43 y.o. female.   Chest Pain   43 year old female presents emergency department with complaints of left-sided chest pain.  Patient states that symptoms began around noon.  States that she was standing while at [employer] and noticed sharp left-sided chest pain with some radiation to her left shoulder.  States the pain is worsened with movement of her left shoulder as well as grabbing objects with her left arm.  Denies any shortness of breath, fever, cough.  Denies any history of DVT/PE, recent surgery/immobilization, known coagulopathy, no malignancy.  Denies any known history of cardiac or pulmonary complications.  Patient was adopted and does not know about family history of medical problems.  Past medical history significant for dysmenorrhea  Home Medications Prior to Admission medications   Medication Sig Start Date End Date Taking? Authorizing Provider  ibuprofen  (ADVIL ) 600 MG tablet Take 1 tablet (600 mg total) by mouth every 6 (six) hours as needed. 05/09/23  Yes Silver Fell A, PA  lidocaine  (LIDODERM ) 5 % Place 1 patch onto the skin daily. Remove & Discard patch within 12 hours or as directed by MD 05/09/23  Yes Silver Fell DELENA, PA  albuterol  (VENTOLIN  HFA) 108 (90 Base) MCG/ACT inhaler Inhale 1 puff into the lungs every 6 (six) hours as needed for wheezing or shortness of breath. Need office visit for additional refill 01/23/23   Nche, Roselie Rockford, NP  butalbital -acetaminophen -caffeine  (FIORICET) 50-325-40 MG tablet Take 1-2 tablets by mouth every 6 (six) hours as needed for up to 365 doses for headache. Patient not taking: Reported on 03/22/2023 02/18/23   Jerral Meth, MD  methylPREDNISolone  (MEDROL  DOSEPAK) 4 MG TBPK tablet Take as directed on package  03/24/23   Nche, Roselie Rockford, NP      Allergies    Patient has no known allergies.    Review of Systems   Review of Systems  Cardiovascular:  Positive for chest pain.    Physical Exam Updated Vital Signs BP (!) 122/107 (BP Location: Left Arm)   Pulse 75   Temp 97.9 F (36.6 C)   Resp 18   Ht 5' 3 (1.6 m)   Wt 61.2 kg   LMP 04/14/2023   SpO2 100%   BMI 23.90 kg/m  Physical Exam Vitals and nursing note reviewed.  Constitutional:      General: She is not in acute distress.    Appearance: She is well-developed.  HENT:     Head: Normocephalic and atraumatic.  Eyes:     Conjunctiva/sclera: Conjunctivae normal.  Cardiovascular:     Rate and Rhythm: Normal rate and regular rhythm.     Pulses: Normal pulses.     Heart sounds: No murmur heard. Pulmonary:     Effort: Pulmonary effort is normal. No respiratory distress.     Breath sounds: Normal breath sounds. No wheezing, rhonchi or rales.     Comments: Left chest wall tenderness to palpation.  No obvious crepitus/step-off/deformity. Chest:     Chest wall: Tenderness present.  Abdominal:     Palpations: Abdomen is soft.     Tenderness: There is no abdominal tenderness.  Musculoskeletal:        General: No swelling.     Cervical back: Neck supple.  Right lower leg: No edema.     Left lower leg: No edema.     Comments: Left chest pain worsened with resisted flexion left shoulder  Skin:    General: Skin is warm and dry.     Capillary Refill: Capillary refill takes less than 2 seconds.  Neurological:     Mental Status: She is alert.  Psychiatric:        Mood and Affect: Mood normal.     ED Results / Procedures / Treatments   Labs (all labs ordered are listed, but only abnormal results are displayed) Labs Reviewed  BASIC METABOLIC PANEL - Abnormal; Notable for the following components:      Result Value   Sodium 133 (*)    Calcium 8.7 (*)    All other components within normal limits  CBC  TROPONIN I (HIGH  SENSITIVITY)  TROPONIN I (HIGH SENSITIVITY)    EKG EKG Interpretation Date/Time:  Tuesday May 09 2023 13:10:21 EST Ventricular Rate:  76 PR Interval:  134 QRS Duration:  88 QT Interval:  374 QTC Calculation: 420 R Axis:   83  Text Interpretation: Normal sinus rhythm Normal ECG When compared with ECG of 18-Feb-2023 15:39, PREVIOUS ECG IS PRESENT Confirmed by Ruthe Cornet [phone]) on 05/09/2023 3:12:24 PM  Radiology DG Chest 2 View Result Date: 05/09/2023 CLINICAL DATA:  Chest pain. EXAM: CHEST - 2 VIEW COMPARISON:  02/18/2023. FINDINGS: Bilateral lung fields are clear. Bilateral costophrenic angles are clear. Normal cardio-mediastinal silhouette. No acute osseous abnormalities. The soft tissues are within normal limits. IMPRESSION: No active cardiopulmonary disease. Electronically Signed   By: Ree Molt M.D.   On: 05/09/2023 13:47    Procedures Procedures    Medications Ordered in ED Medications  ibuprofen  (ADVIL ) tablet 600 mg (600 mg Oral Given 05/09/23 1705)    ED Course/ Medical Decision Making/ A&P                                 Medical Decision Making Amount and/or Complexity of Data Reviewed Labs: ordered. Radiology: ordered.   This patient presents to the ED for concern of chest pain, this involves an extensive number of treatment options, and is a complaint that carries with it a high risk of complications and morbidity.  The differential diagnosis includes ACS, PE, pneumothorax, pneumonia, hemothorax, peritonitis/myocarditis/tamponade, aortic dissection, aortic aneurysm, GERD, musculoskeletal, other   Co morbidities that complicate the patient evaluation  See HPI   Additional history obtained:  Additional history obtained from EMR External records from outside source obtained and reviewed including hospital records   Lab Tests:  I Ordered, and personally interpreted labs.  The pertinent results include: Mild hyponatremia, hypocalcemia 133 and 8.7  respectively.  No renal dysfunction.  No leukocytosis.  No evidence of anemia.  Placed within range.  Initial troponin less than 2 with repeat less than 2   Imaging Studies ordered:  I ordered imaging studies including chest x-ray I independently visualized and interpreted imaging which showed no acute cardiopulmonary abnormality I agree with the radiologist interpretation   Cardiac Monitoring: / EKG:  The patient was maintained on a cardiac monitor.  I personally viewed and interpreted the cardiac monitored which showed an underlying rhythm of: Sinus rhythm   Consultations Obtained:  N/a   Problem List / ED Course / Critical interventions / Medication management  Chest wall pain I ordered medication including Motrin    Reevaluation of the  patient after these medicines showed that the patient improved I have reviewed the patients home medicines and have made adjustments as needed   Social Determinants of Health:  Denies tobacco, illicit drug use   Test / Admission - Considered:  Chest wall pain Vitals signs within normal range and stable throughout visit. Laboratory/imaging studies significant for: See above 43 year old female presents emergency department complaints of left-sided chest pain.  Chest pain present around noon when she was at her work.  On exam, lungs clear to auscultation bilaterally.  Reproducible left-sided chest wall tenderness.  Labs reassuring.  Patient without chest pain rating to back, pulse deficits, hypertension, neurologic deficits; low suspicion for dissection.  Chest x-ray without obvious pneumonia, pneumothorax or other acute cardiopulmonary abnormality.  Patient with delta negative troponin, lack of acute ischemic change on EKG; low suspicion for ACS.  Patient with Malvina PE score 0 and PERC negative; low suspicion for PE.  Suspect patient symptoms likely secondary to musculoskeletal etiology.  Treated with NSAIDs in the emergency department and no  improvement.  Recommend treatment of pain similarly in outpatient setting as well as follow-up with primary care.  Treatment plan discussed length with patient and she acknowledged understanding was agreeable to said plan.  Patient overall well-appearing, afebrile in no acute distress. Worrisome signs and symptoms were discussed with the patient, and the patient acknowledged understanding to return to the ED if noticed. Patient was stable upon discharge.          Final Clinical Impression(s) / ED Diagnoses Final diagnoses:  Left-sided chest wall pain    Rx / DC Orders ED Discharge Orders          Ordered    ibuprofen  (ADVIL ) 600 MG tablet  Every 6 hours PRN        05/09/23 1702    lidocaine  (LIDODERM ) 5 %  Every 24 hours        05/09/23 1704              Silver Wonda LABOR, PA 05/09/23 1708    Ruthe Cornet, DO 05/09/23 1923

## 2023-05-09 NOTE — ED Notes (Signed)
 Needs work note x 2 for 2 different jobs.

## 2023-05-09 NOTE — Discharge Instructions (Addendum)
 As discussed, workup today overall reassuring.  Chest x-ray did not show evidence of pneumonia, collapsed lung or other abnormality.  Your heart enzymes and EKG appeared normal; does not look like you are having a heart attack.  Given that the pain is reproducible by pressing on your chest wall, suspect musculoskeletal etiology of your chest pain.  Will treat with anti-inflammatories as well as rest.  Recommend follow-up with your primary care for reassessment.  Please do not hesitate to return if the worrisome signs and symptoms we discussed become apparent.

## 2023-05-09 NOTE — ED Notes (Signed)

## 2023-05-09 NOTE — ED Triage Notes (Signed)
 Pt states left sided chest pain that started approx 20 min pta  Denis SOB  States pain is sharp and tingling

## 2023-05-11 ENCOUNTER — Encounter: Payer: Self-pay | Admitting: Nurse Practitioner

## 2023-05-11 ENCOUNTER — Ambulatory Visit (INDEPENDENT_AMBULATORY_CARE_PROVIDER_SITE_OTHER): Payer: 59 | Admitting: Nurse Practitioner

## 2023-05-11 VITALS — BP 129/76 | HR 78 | Temp 98.1°F | Resp 18 | Wt 130.4 lb

## 2023-05-11 DIAGNOSIS — R0789 Other chest pain: Secondary | ICD-10-CM | POA: Diagnosis not present

## 2023-05-11 MED ORDER — METHOCARBAMOL 500 MG PO TABS: 500.0000 mg | ORAL_TABLET | Freq: Two times a day (BID) | ORAL | 0 refills | Status: DC | PRN

## 2023-05-11 NOTE — Progress Notes (Signed)
 Established Patient Visit  Patient: Shannon Marks   DOB: 01/19/1981   43 y.o. Female  MRN: [id number] Visit Date: 05/11/2023  Subjective:    Chief Complaint  Patient presents with   Hospitalization Follow-up    Left side chest pain and under arm; pain is still present mainly under left arm.    Muscle Pain This is a new problem. The current episode started in the past 7 days. The problem occurs constantly. The problem is unchanged. The context of the pain is unknown. The pain is present in the chest, left shoulder and left ribs. The pain is severe. The symptoms are aggravated by any movement. Pertinent negatives include no abdominal pain, fatigue, fever, nausea, rash, stiffness, sensory change, shortness of breath, swollen glands, weakness or wheezing. Past treatments include acetaminophen . The treatment provided no relief. There is no swelling present. She has been Behaving normally. There is no history of chronic back pain, rheumatic disease or sickle cell disease.  Eval by urgent care provider 2days ago, Did not take ibuprofen  nor lidocaise as prescribed. Normal CXR and lab results  Reviewed medical, surgical, and social history today  Medications: Outpatient Medications Prior to Visit  Medication Sig   albuterol  (VENTOLIN  HFA) 108 (90 Base) MCG/ACT inhaler Inhale 1 puff into the lungs every 6 (six) hours as needed for wheezing or shortness of breath. Need office visit for additional refill   butalbital -acetaminophen -caffeine  (FIORICET) 50-325-40 MG tablet Take 1-2 tablets by mouth every 6 (six) hours as needed for up to 365 doses for headache.   ibuprofen  (ADVIL ) 600 MG tablet Take 1 tablet (600 mg total) by mouth every 6 (six) hours as needed.   lidocaine  (LIDODERM ) 5 % Place 1 patch onto the skin daily. Remove & Discard patch within 12 hours or as directed by MD   methylPREDNISolone  (MEDROL  DOSEPAK) 4 MG TBPK tablet Take as directed on package (Patient not taking:  Reported on 05/11/2023)   No facility-administered medications prior to visit.   Reviewed past medical and social history.   ROS per HPI above      Objective:  BP 129/76 (BP Location: Right Arm, Patient Position: Sitting, Cuff Size: Normal)   Pulse 78   Temp 98.1 F (36.7 C) (Temporal)   Resp 18   Wt 130 lb 6.4 oz (59.1 kg)   LMP 04/14/2023   SpO2 100%   BMI 23.10 kg/m      Physical Exam Vitals and nursing note reviewed.  Cardiovascular:     Rate and Rhythm: Normal rate.     Pulses: Normal pulses.  Pulmonary:     Effort: Pulmonary effort is normal.  Chest:     Chest wall: Tenderness present. No mass, lacerations, swelling or crepitus.  Breasts:    Breasts are symmetrical.     Left: Normal.  Musculoskeletal:     Left shoulder: Normal.     Left upper arm: Normal.     Cervical back: Normal.     Thoracic back: Normal.  Lymphadenopathy:     Upper Body:     Left upper body: No supraclavicular, axillary or pectoral adenopathy.  Neurological:     Mental Status: She is alert and oriented to person, place, and time.     No results found for any visits on 05/11/23.    Assessment & Plan:    Problem List Items Addressed This Visit   None Visit Diagnoses  Left-sided chest wall pain    -  Primary   Relevant Medications   methocarbamol  (ROBAXIN ) 500 MG tablet     Start ibuprofen , lidocaine  patch and robaxin  as prescribed. Schedule another appointment if no improvement in 1week  Return if symptoms worsen or fail to improve.     Roselie Mood, NP

## 2023-05-11 NOTE — Patient Instructions (Signed)
 Start ibuprofen, lidocaine patch and robaxin as prescribed. Schedule another appointment if no improvement in 1week

## 2023-05-25 ENCOUNTER — Telehealth (INDEPENDENT_AMBULATORY_CARE_PROVIDER_SITE_OTHER): Payer: Self-pay | Admitting: Otolaryngology

## 2023-05-25 NOTE — Telephone Encounter (Signed)
LVM to confirm appt & location [id number] afm

## 2023-05-26 ENCOUNTER — Institutional Professional Consult (permissible substitution) (INDEPENDENT_AMBULATORY_CARE_PROVIDER_SITE_OTHER): Payer: 59

## 2023-06-01 ENCOUNTER — Encounter: Payer: Self-pay | Admitting: Family Medicine

## 2023-06-01 ENCOUNTER — Ambulatory Visit: Payer: 59 | Admitting: Family Medicine

## 2023-06-01 ENCOUNTER — Ambulatory Visit: Payer: 59

## 2023-06-01 ENCOUNTER — Ambulatory Visit: Payer: Self-pay | Admitting: Nurse Practitioner

## 2023-06-01 ENCOUNTER — Telehealth: Payer: Self-pay

## 2023-06-01 VITALS — BP 132/76 | HR 78 | Temp 97.2°F | Ht 62.0 in | Wt 128.4 lb

## 2023-06-01 DIAGNOSIS — R0789 Other chest pain: Secondary | ICD-10-CM

## 2023-06-01 MED ORDER — IBUPROFEN 800 MG PO TABS: 800.0000 mg | ORAL_TABLET | Freq: Three times a day (TID) | ORAL | 0 refills | Status: DC | PRN

## 2023-06-01 MED ORDER — KETOROLAC TROMETHAMINE 60 MG/2ML IM SOLN
60.0000 mg | Freq: Once | INTRAMUSCULAR | Status: AC
  Administered 2023-06-01: 60 mg via INTRAMUSCULAR

## 2023-06-01 MED ORDER — METHOCARBAMOL 500 MG PO TABS: 500.0000 mg | ORAL_TABLET | Freq: Three times a day (TID) | ORAL | 0 refills | Status: DC | PRN

## 2023-06-01 NOTE — Telephone Encounter (Signed)
Copied from CRM [id number]. Topic: Appointments - Appointment Scheduling >> Jun 01, 2023 12:14 PM [place] A wrote: Patient is having pain under left arm area near breast bone-Patient declined to speak to nurse pain level 10. Patient was asked and explained that it would be nest if the nurse speak with her to better triage her. Patient stated"I would rather sit in your office than speak to the nurse" Agent called CAL and was informed to take message and send  Patient was seen today in the office by Dr Janee Morn. Dm/cma

## 2023-06-01 NOTE — Progress Notes (Signed)
Assessment/Plan:   Assessment and Plan    Left Arm Pain Intermittent sharp shooting pain in the left arm for the past few weeks, exacerbated by abduction. Pain rated 10/10. No fever, chills, or signs of abscess. Differential diagnosis includes cervical radiculopathy, thoracic outlet syndrome, costochondritis, or musculoskeletal chest pain secondary to overuse or injury. Previous workup including troponins, EKG, and chest x-ray were unremarkable.  - Administer Toradol 60 mg IM injection - Prescribe methocarbamol [id number] mg q8h prn  - Order x-rays of cervical spine, thoracic spine, and left shoulder - Recommend ibuprofen 800 mg three times a day as needed, with food - Advise rest and avoidance of activities that exacerbate pain - Provide sick leave for five days - Instruct to follow up if symptoms persist or worsen, or if imaging reveals abnormalities  General Health Maintenance Discussed the importance of taking ibuprofen with food and avoiding high doses long-term to prevent kidney issues. - Advise to take ibuprofen with food - Instruct not to take ibuprofen within six hours of Toradol injection  Follow-up - Follow up with imaging results in a few days - Consider referral to orthopedics if symptoms persist or imaging reveals abnormalities.        Medications Discontinued During This Encounter  Medication Reason   ibuprofen (ADVIL) 600 MG tablet    methocarbamol (ROBAXIN) 500 MG tablet Reorder    Return in about 7 years (around 05/31/2030), or if symptoms worsen or fail to improve.    Subjective:   Encounter date: 06/01/2023  Shannon Marks is a 43 y.o. 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   She  has a past medical history of Marital conflict (06/25/2021)..   She presents with chief complaint of Pain (Pain in left armpit started last night progressed ) .  Discussed  the use of AI scribe software for clinical note transcription with the patient, who gave verbal consent to proceed.  History of Present Illness   The patient presents with ongoing arm pain.  She has been experiencing sharp, shooting arm pain for the past couple of weeks, which began suddenly while standing in [employer]. The pain is rated 10 out of 10 in severity and is exacerbated by movements such as reaching above her head. It radiates throughout her arm. Initially, the pain started last night while at [facility], despite not lifting anything. 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 However, the pain returned late at night and increased in intensity today.  She has been using lidocaine patches and alternating hot and cold compresses to manage the pain. She has taken 200 mg of Tylenol but avoids taking more medication due to concerns about kidney health. She has also used methocarbamol in the past, which provided some relief, but she is currently out of it. No fevers, chills, or pain in other joints. There is no history of acne or abscesses in the affected area.  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       ROS  Past Surgical History:  Procedure Laterality Date   TUBAL LIGATION      Outpatient Medications Prior to Visit  Medication Sig Dispense Refill   albuterol (VENTOLIN HFA) 108 (90 Base) MCG/ACT inhaler Inhale 1 puff into the lungs every 6 (six)  hours as needed for wheezing or shortness of breath. Need office visit for additional refill 6.7 g 0   butalbital-acetaminophen-caffeine (FIORICET) 50-325-40 MG tablet Take 1-2 tablets by mouth every 6 (six) hours as needed for up to 365 doses for headache. 10 tablet 0   lidocaine (LIDODERM) 5 % Place 1 patch onto the skin daily. Remove & Discard patch  within 12 hours or as directed by MD 30 patch 0   methylPREDNISolone (MEDROL DOSEPAK) 4 MG TBPK tablet Take as directed on package 21 tablet 0   ibuprofen (ADVIL) 600 MG tablet Take 1 tablet (600 mg total) by mouth every 6 (six) hours as needed. 30 tablet 0   methocarbamol (ROBAXIN) 500 MG tablet Take 1 tablet (500 mg total) by mouth 2 (two) times daily as needed for muscle spasms. 14 tablet 0   No facility-administered medications prior to visit.    Family History  Family history unknown: Yes    Social History   Socioeconomic History   Marital status: Single    Spouse name: Not on file   Number of children: Not on file   Years of education: Not on file   Highest education level: GED or equivalent  Occupational History   Not on file  Tobacco Use   Smoking status: Never   Smokeless tobacco: Never  Vaping Use   Vaping status: Never Used  Substance and Sexual Activity   Alcohol use: No   Drug use: No   Sexual activity: Yes    Birth control/protection: Surgical  Other Topics Concern   Not on file  Social History Narrative   Not on file   Social Drivers of Health   Financial Resource Strain: Low Risk  (03/13/2023)   Overall Financial Resource Strain (CARDIA)    Difficulty of Paying Living Expenses: Not hard at all  Food Insecurity: No Food Insecurity (03/13/2023)   Hunger Vital Sign    Worried About Running Out of Food in the Last Year: Never true    Ran Out of Food in the Last Year: Never true  Transportation Needs: No Transportation Needs (03/13/2023)   PRAPARE - Administrator, Civil Service (Medical): No    Lack of Transportation (Non-Medical): No  Physical Activity: Sufficiently Active (03/13/2023)   Exercise Vital Sign    Days of Exercise per Week: 7 days    Minutes of Exercise per Session: 30 min  Stress: No Stress Concern Present (03/13/2023)   [employer] of Occupational Health - Occupational Stress Questionnaire    Feeling of Stress : Not  at all  Social Connections: Moderately Isolated (03/13/2023)   Social Connection and Isolation Panel [NHANES]    Frequency of Communication with Friends and Family: More than three times a week    Frequency of Social Gatherings with Friends and Family: More than three times a week    Attends Religious Services: More than 4 times per year    Active Member of [employer] or Organizations: No    Attends Engineer, structural: Not on file    Marital Status: Divorced  Intimate Partner Violence: Not on file  Objective:  Physical Exam: BP 132/76 (BP Location: Right Arm, Patient Position: Sitting, Cuff Size: Normal)   Pulse 78   Temp (!) 97.2 F (36.2 C) (Temporal)   Ht 5\' 2"  (1.575 m)   Wt 128 lb 6.4 oz (58.2 kg)   LMP 04/14/2023   SpO2 99%   BMI 23.48 kg/m     Physical Exam Chaperone present: Patient did not disrobe.  Chest:     Chest wall: Tenderness present.    Musculoskeletal:     Left shoulder: No tenderness or crepitus. Decreased range of motion.     Left upper arm: Normal.     Cervical back: No bony tenderness. No pain with movement.     Thoracic back: No bony tenderness. Normal range of motion.    DG Chest 2 View Result Date: 05/09/2023 CLINICAL DATA:  Chest pain. EXAM: CHEST - 2 VIEW COMPARISON:  02/18/2023. FINDINGS: Bilateral lung fields are clear. Bilateral costophrenic angles are clear. Normal cardio-mediastinal silhouette. No acute osseous abnormalities. The soft tissues are within normal limits. IMPRESSION: No active cardiopulmonary disease. Electronically Signed   By: Jules Schick M.D.   On: 05/09/2023 13:47    Recent Results (from the past 2160 hours)  POC COVID-19     Status: Normal   Collection Time: 03/22/23  9:23 AM  Result Value Ref Range   SARS Coronavirus 2 Ag Negative Negative  POC Influenza A&B (Binax test)     Status: Normal   Collection Time:  03/22/23  9:23 AM  Result Value Ref Range   Influenza A, POC Negative Negative   Influenza B, POC Negative Negative  POCT rapid strep A     Status: Normal   Collection Time: 03/22/23  9:23 AM  Result Value Ref Range   Rapid Strep A Screen Negative Negative  Basic metabolic panel     Status: Abnormal   Collection Time: 05/09/23  1:07 PM  Result Value Ref Range   Sodium 133 (L) 135 - 145 mmol/L   Potassium 4.1 3.5 - 5.1 mmol/L   Chloride 105 98 - 111 mmol/L   CO2 22 22 - 32 mmol/L   Glucose, Bld 89 70 - 99 mg/dL    Comment: Glucose reference range applies only to samples taken after fasting for at least 8 hours.   BUN 8 6 - 20 mg/dL   Creatinine, Ser 4.09 0.44 - 1.00 mg/dL   Calcium 8.7 (L) 8.9 - 10.3 mg/dL   GFR, Estimated >81 >19 mL/min    Comment: (NOTE) Calculated using the CKD-EPI Creatinine Equation (2021)    Anion gap 6 5 - 15    Comment: Performed at [facility], [address] [place], [place] [address]  CBC     Status: None   Collection Time: 05/09/23  1:07 PM  Result Value Ref Range   WBC 5.6 4.0 - 10.5 K/uL   RBC 4.18 3.87 - 5.11 MIL/uL   Hemoglobin 12.6 12.0 - 15.0 g/dL   HCT 95.6 21.3 - 08.6 %   MCV 89.2 80.0 - 100.0 fL   MCH 30.1 26.0 - 34.0 pg   MCHC 33.8 30.0 - 36.0 g/dL   RDW 57.8 46.9 - 62.9 %   Platelets 183 150 - 400 K/uL   nRBC 0.0 0.0 - 0.2 %    Comment: Performed at [facility], [address] [place], [place] [address]  Troponin I (High Sensitivity)     Status: None   Collection  Time: 05/09/23  1:07 PM  Result Value Ref Range   Troponin I (High Sensitivity) <2 <18 ng/L    Comment: (NOTE) Elevated high sensitivity troponin I (hsTnI) values and significant  changes across serial measurements may suggest ACS but many other  chronic and acute conditions are known to elevate hsTnI results.  Refer to the "Links" section for chest pain algorithms and additional  guidance. Performed at [facility], [address] [place], [place] [address]   Troponin I (High Sensitivity)     Status: None   Collection Time: 05/09/23  4:23 PM  Result Value Ref Range   Troponin I (High Sensitivity) <2 <18 ng/L    Comment: (NOTE) Elevated high sensitivity troponin I (hsTnI) values and significant  changes across serial measurements may suggest ACS but many other  chronic and acute conditions are known to elevate hsTnI results.  Refer to the "Links" section for chest pain algorithms and additional  guidance. Performed at [facility], [address] [place], [place] [address]         Garner Nash, MD, MS

## 2023-06-01 NOTE — Telephone Encounter (Signed)
Copied from CRM [id number]. Topic: Clinical - Red Word Triage >> Jun 01, 2023 12:32 PM Deaijah H wrote: Red Word that prompted transfer to Nurse Triage: Sharp Pain under left arm (hard to move)   Chief Complaint: Arm pain Symptoms: Arm pain Frequency: Pain with movement of arm, pain improved when not moving arm Pertinent Negatives: Patient denies neck pain, chest pain, numbness, weakness Disposition: [] ED /[] Urgent Care (no appt availability in office) / [x] Appointment(In office/virtual)/ []  [facility]/ [] Home Care/ [] Refused Recommended Disposition /[] [facility]/ []  Follow-up with PCP Additional Notes: Patient reports that she began to experience pain in her left arm in her armpit last night. She states that the pain is 10/10 and is present when moving or using the arm. She denies any chest pain, numbness, weakness, or neck pain. Patient states she has had similar symptoms in the past and her PCP was able go prescribe medication that helped. Appointment made for today for the patient.     Reason for Disposition  [1] Arm pains with exertion (e.g., walking) AND [2] pain goes away on resting AND [3] not present now  Answer Assessment - Initial Assessment Questions 1. ONSET: "When did the pain start?"     Last night 2. LOCATION: "Where is the pain located?"     Under left arm/armpit 3. PAIN: "How bad is the pain?" (Scale 1-10; or mild, moderate, severe)   - MILD (1-3): Doesn't interfere with normal activities.   - MODERATE (4-7): Interferes with normal activities (e.g., work or school) or awakens from sleep.   - SEVERE (8-10): Excruciating pain, unable to do any normal activities, unable to hold a cup of water.     10/10 4. WORK OR EXERCISE: "Has there been any recent work or exercise that involved this part of the body?"     No 5. CAUSE: "What do you think is causing the arm pain?"     No 6. OTHER SYMPTOMS: "Do you have any other symptoms?" (e.g., neck pain,  swelling, rash, fever, numbness, weakness)     No 7. PREGNANCY: "Is there any chance you are pregnant?" "When was your last menstrual period?"     No  Protocols used: Arm Pain-A-AH

## 2023-06-01 NOTE — Patient Instructions (Signed)
VISIT SUMMARY:  During today's visit, we discussed your ongoing arm pain, which has been severe and affecting your daily activities. We reviewed your symptoms, previous treatments, and conducted a thorough examination to determine the best course of action.  YOUR PLAN:  -LEFT ARM PAIN: Your arm pain, which has been sharp and shooting, may be due to several possible causes such as nerve issues, muscle strain, or other conditions. We administered a Toradol injection for immediate pain relief and prescribed methocarbamol to help with muscle relaxation. We also ordered x-rays of your cervical spine, thoracic spine, and left shoulder to get a clearer picture of what might be causing the pain. Additionally, we recommend taking ibuprofen 800 mg three times a day with food to help reduce inflammation and pain. Please rest and avoid activities that make the pain worse. You have been provided with sick leave for five days to help with your recovery. Follow up with [place] if your symptoms persist or worsen, or if the imaging shows any abnormalities.  -GENERAL HEALTH MAINTENANCE: We discussed the importance of taking ibuprofen with food to prevent stomach issues and avoiding high doses long-term to protect your kidneys. Do not take ibuprofen within six hours of the Toradol injection.  INSTRUCTIONS:  Please follow up with [place] in a few days to review the imaging results. If your symptoms persist or the imaging reveals any abnormalities, we may refer you to an orthopedic specialist for further evaluation.

## 2023-06-01 NOTE — Telephone Encounter (Signed)
Pt was seen by Dr. Janee Morn today

## 2023-06-09 ENCOUNTER — Encounter: Payer: Self-pay | Admitting: Nurse Practitioner

## 2023-06-09 ENCOUNTER — Ambulatory Visit (INDEPENDENT_AMBULATORY_CARE_PROVIDER_SITE_OTHER): Payer: 59 | Admitting: Nurse Practitioner

## 2023-06-09 VITALS — BP 108/70 | HR 66 | Temp 98.3°F | Wt 131.6 lb

## 2023-06-09 DIAGNOSIS — M792 Neuralgia and neuritis, unspecified: Secondary | ICD-10-CM

## 2023-06-09 DIAGNOSIS — M5134 Other intervertebral disc degeneration, thoracic region: Secondary | ICD-10-CM | POA: Diagnosis not present

## 2023-06-09 DIAGNOSIS — M5030 Other cervical disc degeneration, unspecified cervical region: Secondary | ICD-10-CM | POA: Diagnosis not present

## 2023-06-09 DIAGNOSIS — M25512 Pain in left shoulder: Secondary | ICD-10-CM

## 2023-06-09 MED ORDER — LIDOCAINE 5 % EX PTCH: 1.0000 | MEDICATED_PATCH | CUTANEOUS | 0 refills | Status: DC

## 2023-06-09 NOTE — Patient Instructions (Signed)
 Continue use of ibuprofen  and lidocaine  patch Will enter PT referral if radiology report is normal.

## 2023-06-09 NOTE — Assessment & Plan Note (Signed)
 Associated with radiating pain to left shoulder and left chest wall. Pain is worse with movement or lifting. No rash or erythema on skin or lymphadenopathy. Reports improved pain and ROM with medrol  dose pack, toradol  IM, lidocaine  patch and tylenol . X-ray of cervical and thoracic spine:No evidence of a thoracic vertebral compression fracture. Mild-to-moderate T4-T5 disc space narrowing. No more than mild disc space narrowing at the remaining levels.Dextrocurvature of the thoracic spine. Cervical spondylosis as described within the body of the report. Bilateral bony neural foraminal narrowing at C4-C5 and C5-C6. Mild C5-C6 grade 1 retrolisthesis which does not significantly worsened or improve in flexion or extension.  Advised to continue ibuprofen  800mg  in Am and tylenol  650mg  in PM, robaxin  in PM and lidocaine  patch as prescribed Entered referral to outpatient PT

## 2023-06-09 NOTE — Progress Notes (Signed)
 Established Patient Visit  Patient: Shannon Marks   DOB: 1980/07/27   43 y.o. Female  MRN: [id number] Visit Date: 06/09/2023  Subjective:    Chief Complaint  Patient presents with   Follow-up    Chest wall pain not as bad, but still present from OV 06/01/2023.  Completed imaging   HPI DDD (degenerative disc disease), cervical Associated with radiating pain to left shoulder and left chest wall. Pain is worse with movement or lifting. No rash or erythema on skin or lymphadenopathy. Reports improved pain and ROM with medrol  dose pack, toradol  IM, lidocaine  patch and tylenol . X-ray of cervical and thoracic spine:No evidence of a thoracic vertebral compression fracture. Mild-to-moderate T4-T5 disc space narrowing. No more than mild disc space narrowing at the remaining levels.Dextrocurvature of the thoracic spine. Cervical spondylosis as described within the body of the report. Bilateral bony neural foraminal narrowing at C4-C5 and C5-C6. Mild C5-C6 grade 1 retrolisthesis which does not significantly worsened or improve in flexion or extension.  Advised to continue ibuprofen  800mg  in Am and tylenol  650mg  in PM, robaxin  in PM and lidocaine  patch as prescribed Entered referral to outpatient PT  Reviewed medical, surgical, and social history today  Medications: Outpatient Medications Prior to Visit  Medication Sig   albuterol  (VENTOLIN  HFA) 108 (90 Base) MCG/ACT inhaler Inhale 1 puff into the lungs every 6 (six) hours as needed for wheezing or shortness of breath. Need office visit for additional refill   butalbital -acetaminophen -caffeine  (FIORICET) 50-325-40 MG tablet Take 1-2 tablets by mouth every 6 (six) hours as needed for up to 365 doses for headache.   ibuprofen  (ADVIL ) 800 MG tablet Take 1 tablet (800 mg total) by mouth every 8 (eight) hours as needed (pain).   methocarbamol  (ROBAXIN ) 500 MG tablet Take 1-2 tablets (500-1,000 mg total) by mouth every 8 (eight) hours  as needed for muscle spasms.   [DISCONTINUED] methylPREDNISolone  (MEDROL  DOSEPAK) 4 MG TBPK tablet Take as directed on package   [DISCONTINUED] lidocaine  (LIDODERM ) 5 % Place 1 patch onto the skin daily. Remove & Discard patch within 12 hours or as directed by MD (Patient not taking: Reported on 06/09/2023)   No facility-administered medications prior to visit.   Reviewed past medical and social history.   ROS per HPI above      Objective:  BP 108/70   Pulse 66   Temp 98.3 F (36.8 C) (Temporal)   Wt 131 lb 9.6 oz (59.7 kg)   SpO2 97%   BMI 24.07 kg/m      Physical Exam Vitals and nursing note reviewed.  Constitutional:      General: She is not in acute distress. Cardiovascular:     Rate and Rhythm: Normal rate.     Pulses: Normal pulses.  Pulmonary:     Effort: Pulmonary effort is normal.  Musculoskeletal:     Left shoulder: Tenderness present. No swelling, deformity, effusion, laceration, bony tenderness or crepitus. Normal range of motion. Normal strength. Normal pulse.     Left upper arm: Normal.     Left elbow: Normal.     Cervical back: Normal range of motion and neck supple.     Comments: Guarded but normal left shoulder ROM  Lymphadenopathy:     Cervical: No cervical adenopathy.     Upper Body:     Left upper body: No supraclavicular, axillary or pectoral adenopathy.  Neurological:     Mental  Status: She is alert.     No results found for any visits on 06/09/23.    Assessment & Plan:    Problem List Items Addressed This Visit     DDD (degenerative disc disease), cervical   Associated with radiating pain to left shoulder and left chest wall. Pain is worse with movement or lifting. No rash or erythema on skin or lymphadenopathy. Reports improved pain and ROM with medrol  dose pack, toradol  IM, lidocaine  patch and tylenol . X-ray of cervical and thoracic spine:No evidence of a thoracic vertebral compression fracture. Mild-to-moderate T4-T5 disc space  narrowing. No more than mild disc space narrowing at the remaining levels.Dextrocurvature of the thoracic spine. Cervical spondylosis as described within the body of the report. Bilateral bony neural foraminal narrowing at C4-C5 and C5-C6. Mild C5-C6 grade 1 retrolisthesis which does not significantly worsened or improve in flexion or extension.  Advised to continue ibuprofen  800mg  in Am and tylenol  650mg  in PM, robaxin  in PM and lidocaine  patch as prescribed Entered referral to outpatient PT      Relevant Orders   Ambulatory referral to Physical Therapy   DDD (degenerative disc disease), thoracic   Relevant Orders   Ambulatory referral to Physical Therapy   Other Visit Diagnoses       Radicular pain in left arm    -  Primary   Relevant Medications   lidocaine  (LIDODERM ) 5 %   Other Relevant Orders   Ambulatory referral to Physical Therapy     Acute pain of left shoulder       Relevant Orders   Ambulatory referral to Physical Therapy      Return in about 9 months (around 03/08/2024) for CPE (fasting).     Roselie Mood, NP

## 2023-06-10 ENCOUNTER — Encounter: Payer: Self-pay | Admitting: Family Medicine

## 2023-06-12 ENCOUNTER — Ambulatory Visit: Payer: 59 | Admitting: Family Medicine

## 2023-06-12 VITALS — BP 132/82 | HR 92 | Temp 98.9°F | Ht 62.0 in | Wt 131.8 lb

## 2023-06-12 DIAGNOSIS — Z1152 Encounter for screening for COVID-19: Secondary | ICD-10-CM

## 2023-06-12 DIAGNOSIS — J101 Influenza due to other identified influenza virus with other respiratory manifestations: Secondary | ICD-10-CM

## 2023-06-12 DIAGNOSIS — J4521 Mild intermittent asthma with (acute) exacerbation: Secondary | ICD-10-CM

## 2023-06-12 DIAGNOSIS — R6889 Other general symptoms and signs: Secondary | ICD-10-CM | POA: Diagnosis not present

## 2023-06-12 LAB — POCT INFLUENZA A/B
Influenza A, POC: POSITIVE — AB
Influenza B, POC: NEGATIVE

## 2023-06-12 LAB — POC COVID19 BINAXNOW: SARS Coronavirus 2 Ag: NEGATIVE

## 2023-06-12 MED ORDER — PREDNISONE 20 MG PO TABS: 20.0000 mg | ORAL_TABLET | Freq: Two times a day (BID) | ORAL | 0 refills | Status: AC

## 2023-06-12 MED ORDER — ALBUTEROL SULFATE HFA 108 (90 BASE) MCG/ACT IN AERS: 1.0000 | INHALATION_SPRAY | Freq: Four times a day (QID) | RESPIRATORY_TRACT | 1 refills | Status: DC | PRN

## 2023-06-12 MED ORDER — ALBUTEROL SULFATE (2.5 MG/3ML) 0.083% IN NEBU
2.5000 mg | INHALATION_SOLUTION | Freq: Once | RESPIRATORY_TRACT | Status: AC
  Administered 2023-06-12: 2.5 mg via RESPIRATORY_TRACT

## 2023-06-12 MED ORDER — METHYLPREDNISOLONE SODIUM SUCC 125 MG IJ SOLR
125.0000 mg | Freq: Once | INTRAMUSCULAR | Status: AC
  Administered 2023-06-12: 125 mg via INTRAMUSCULAR

## 2023-06-12 MED ORDER — PROMETHAZINE-DM 6.25-15 MG/5ML PO SYRP: 5.0000 mL | ORAL_SOLUTION | Freq: Four times a day (QID) | ORAL | 0 refills | Status: DC | PRN

## 2023-06-12 MED ORDER — OSELTAMIVIR PHOSPHATE 75 MG PO CAPS: 75.0000 mg | ORAL_CAPSULE | Freq: Two times a day (BID) | ORAL | 0 refills | Status: AC

## 2023-06-12 NOTE — Progress Notes (Signed)
 Established Patient Office Visit   Subjective:  Patient ID: Shannon Marks, female    DOB: [birth year]  Age: 43 y.o. MRN: [id number]  Chief Complaint  Patient presents with   Cough    Cough, body aches, fever and chills, diaffhea headache x 1 day.     Cough Associated symptoms include chills, a fever, headaches and wheezing. Pertinent negatives include no eye redness, myalgias or rash.   Encounter Diagnoses  Name Primary?   Encounter for screening for COVID-19 Yes   Flu-like symptoms    Influenza A    Mild intermittent reactive airway disease with acute exacerbation    1 day history of headache, fever chills, postnasal drip with sore throat, cough with wheezing, nausea vomiting, diarrhea, myalgias and arthralgias.  History of asthma.  She has been using her inhaler more frequently.   Review of Systems  Constitutional:  Positive for chills, fever and malaise/fatigue.  HENT: Negative.    Eyes:  Negative for blurred vision, discharge and redness.  Respiratory:  Positive for cough and wheezing.   Cardiovascular: Negative.   Gastrointestinal:  Positive for diarrhea, nausea and vomiting. Negative for abdominal pain.  Genitourinary: Negative.   Musculoskeletal:  Positive for joint pain. Negative for myalgias.  Skin:  Negative for rash.  Neurological:  Positive for headaches. Negative for tingling, loss of consciousness and weakness.  Endo/Heme/Allergies:  Negative for polydipsia.     Current Outpatient Medications:    albuterol  (VENTOLIN  HFA) 108 (90 Base) MCG/ACT inhaler, Inhale 1-2 puffs into the lungs every 6 (six) hours as needed for wheezing or shortness of breath., Disp: 8 g, Rfl: 1   ibuprofen  (ADVIL ) 800 MG tablet, Take 1 tablet (800 mg total) by mouth every 8 (eight) hours as needed (pain)., Disp: 30 tablet, Rfl: 0   oseltamivir  (TAMIFLU ) 75 MG capsule, Take 1 capsule (75 mg total) by mouth 2 (two) times daily for 5 days., Disp: 10 capsule, Rfl: 0   predniSONE   (DELTASONE ) 20 MG tablet, Take 1 tablet (20 mg total) by mouth 2 (two) times daily with a meal for 7 days., Disp: 14 tablet, Rfl: 0   promethazine -dextromethorphan (PROMETHAZINE -DM) 6.25-15 MG/5ML syrup, Take 5 mLs by mouth 4 (four) times daily as needed for cough., Disp: 118 mL, Rfl: 0   butalbital -acetaminophen -caffeine  (FIORICET) 50-325-40 MG tablet, Take 1-2 tablets by mouth every 6 (six) hours as needed for up to 365 doses for headache. (Patient not taking: Reported on 06/12/2023), Disp: 10 tablet, Rfl: 0   lidocaine  (LIDODERM ) 5 %, Place 1 patch onto the skin daily. Remove & Discard patch within 12 hours or as directed by MD (Patient not taking: Reported on 06/12/2023), Disp: 30 patch, Rfl: 0   methocarbamol  (ROBAXIN ) 500 MG tablet, Take 1-2 tablets (500-1,000 mg total) by mouth every 8 (eight) hours as needed for muscle spasms. (Patient not taking: Reported on 06/12/2023), Disp: 60 tablet, Rfl: 0   Objective:     BP 132/82   Pulse 92   Temp 98.9 F (37.2 C)   Ht 5\' 2"  (1.575 m)   Wt 131 lb 12.8 oz (59.8 kg)   SpO2 97%   BMI 24.11 kg/m    Physical Exam Constitutional:      General: She is not in acute distress.    Appearance: Normal appearance. She is not ill-appearing, toxic-appearing or diaphoretic.  HENT:     Head: Normocephalic and atraumatic.     Right Ear: Tympanic membrane, ear canal and external ear normal.  Left Ear: Tympanic membrane, ear canal and external ear normal.     Mouth/Throat:     Mouth: Mucous membranes are moist.     Pharynx: Oropharynx is clear. No oropharyngeal exudate or posterior oropharyngeal erythema.  Eyes:     General: No scleral icterus.       Right eye: No discharge.        Left eye: No discharge.     Extraocular Movements: Extraocular movements intact.     Conjunctiva/sclera: Conjunctivae normal.     Pupils: Pupils are equal, round, and reactive to light.  Cardiovascular:     Rate and Rhythm: Normal rate and regular rhythm.  Pulmonary:      Effort: Pulmonary effort is normal. No respiratory distress.     Breath sounds: No stridor. Wheezing present. No rhonchi or rales.  Abdominal:     General: Bowel sounds are normal.     Tenderness: There is no abdominal tenderness. There is no guarding.  Musculoskeletal:     Cervical back: No rigidity or tenderness.  Skin:    General: Skin is warm and dry.  Neurological:     Mental Status: She is alert and oriented to person, place, and time.  Psychiatric:        Mood and Affect: Mood normal.        Behavior: Behavior normal.      Results for orders placed or performed in visit on 06/12/23  POC COVID-19 BinaxNow  Result Value Ref Range   SARS Coronavirus 2 Ag Negative Negative  POCT Influenza A/B  Result Value Ref Range   Influenza A, POC Positive (A) Negative   Influenza B, POC Negative Negative      The 10-year ASCVD risk score (Arnett DK, et al., 2019) is: 0.6%    Assessment & Plan:   Encounter for screening for COVID-19 -     POC COVID-19 BinaxNow  Flu-like symptoms -     POCT Influenza A/B  Influenza A -     Oseltamivir  Phosphate; Take 1 capsule (75 mg total) by mouth 2 (two) times daily for 5 days.  Dispense: 10 capsule; Refill: 0 -     Promethazine -DM; Take 5 mLs by mouth 4 (four) times daily as needed for cough.  Dispense: 118 mL; Refill: 0 -     Albuterol  Sulfate HFA; Inhale 1-2 puffs into the lungs every 6 (six) hours as needed for wheezing or shortness of breath.  Dispense: 8 g; Refill: 1  Mild intermittent reactive airway disease with acute exacerbation -     Albuterol  Sulfate -     methylPREDNISolone  Sodium Succ -     predniSONE ; Take 1 tablet (20 mg total) by mouth 2 (two) times daily with a meal for 7 days.  Dispense: 14 tablet; Refill: 0    Return To emergency room in the next day or 2 if breathing does not improve or if worsens..  Take all medicines as directed.  Note given for out of work until the 17th.   Tonna Frederic, MD

## 2023-06-20 ENCOUNTER — Ambulatory Visit: Payer: 59 | Admitting: Family Medicine

## 2023-06-23 ENCOUNTER — Ambulatory Visit: Payer: 59 | Admitting: Nurse Practitioner

## 2023-06-26 ENCOUNTER — Telehealth: Payer: Self-pay

## 2023-06-26 ENCOUNTER — Ambulatory Visit: Payer: 59 | Attending: Nurse Practitioner | Admitting: Physical Therapy

## 2023-06-26 DIAGNOSIS — Z0184 Encounter for antibody response examination: Secondary | ICD-10-CM

## 2023-06-26 NOTE — Therapy (Incomplete)
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  Rationale for Evaluation and Treatment: Rehabilitation  ONSET DATE: 06/09/23  SUBJECTIVE:                                                                                                                                                                                                         SUBJECTIVE STATEMENT: *** Hand dominance: {MISC; OT HAND DOMINANCE:[id number]}  PERTINENT HISTORY:  ***  PAIN:  Are you having pain? Yes: NPRS scale: *** Pain location: *** Pain description: *** Aggravating factors: *** Relieving factors: ***  PRECAUTIONS: {Therapy precautions:24002}  RED FLAGS: {PT Red Flags:29287}     WEIGHT BEARING RESTRICTIONS: {Yes ***/No:24003}  FALLS:  Has patient fallen in last 6 months? {fallsyesno:27318}  LIVING ENVIRONMENT: Lives with: {OPRC lives with:25569::"lives with their family"} Lives in: {Lives in:25570} Stairs: {opstairs:27293} Has following equipment at home: {Assistive devices:23999}  OCCUPATION: ***  PLOF: {PLOF:24004}  PATIENT GOALS: ***  NEXT MD VISIT: ***  OBJECTIVE:  Note: Objective measures were completed at Evaluation unless otherwise noted.  DIAGNOSTIC FINDINGS:   IMPRESSION: 1. Cervical spondylosis as described within the body of the report. 2. Bilateral bony neural foraminal narrowing at C4-C5 and C5-C6. 3. Mild C5-C6 grade 1 retrolisthesis which does not significantly worsened or improve in flexion or extension.  PATIENT SURVEYS:  {rehab surveys:24030}  COGNITION: Overall cognitive status: {cognition:24006}  SENSATION: {sensation:27233}  POSTURE: {posture:25561}  PALPATION: ***   CERVICAL ROM:   {AROM/PROM:27142} ROM A/PROM (deg) eval  Flexion   Extension   Right lateral flexion   Left lateral flexion   Right rotation   Left rotation    (Blank rows = not tested)  UPPER EXTREMITY ROM:  {AROM/PROM:27142} ROM Right eval Left eval  Shoulder flexion    Shoulder extension    Shoulder abduction    Shoulder adduction    Shoulder extension  Shoulder internal rotation    Shoulder external rotation    Elbow flexion    Elbow extension    Wrist flexion    Wrist extension    Wrist ulnar deviation    Wrist radial deviation    Wrist pronation    Wrist supination     (Blank rows = not tested)  UPPER EXTREMITY MMT:  MMT Right eval Left eval  Shoulder flexion    Shoulder extension    Shoulder abduction    Shoulder adduction    Shoulder extension    Shoulder internal rotation    Shoulder external rotation    Middle trapezius    Lower trapezius    Elbow flexion    Elbow extension    Wrist flexion    Wrist extension    Wrist ulnar deviation    Wrist radial deviation    Wrist pronation    Wrist supination    Grip strength     (Blank rows = not tested)  CERVICAL SPECIAL TESTS:  {Cervical special tests:25246}  FUNCTIONAL TESTS:  {Functional tests:24029}  TREATMENT DATE: ***                                                                                                                                 PATIENT EDUCATION:  Education details: *** Person educated: {Person educated:25204} Education method:  {Education Method:25205} Education comprehension: {Education Comprehension:25206}  HOME EXERCISE PROGRAM: ***  ASSESSMENT:  CLINICAL IMPRESSION: Patient is a 43 y.o. female who was seen today for physical therapy evaluation and treatment for ***.   OBJECTIVE IMPAIRMENTS: decreased activity tolerance, decreased coordination, decreased endurance, decreased ROM, decreased strength, increased fascial restrictions, increased muscle spasms, impaired flexibility, impaired sensation, impaired UE functional use, improper body mechanics, postural dysfunction, and pain.   REHAB POTENTIAL: Good  CLINICAL DECISION MAKING: Evolving/moderate complexity  EVALUATION COMPLEXITY: Low   GOALS: Goals reviewed with patient? Yes  SHORT TERM GOALS: Target date: 07/14/23  Independent with initial HEP Baseline:  Goal status: INITIAL  LONG TERM GOALS: Target date: 09/23/23  Understand posture and body mechanics Baseline:  Goal status: INITIAL  2.  *** Baseline:  Goal status: INITIAL  3.  *** Baseline:  Goal status: INITIAL  4.  *** Baseline:  Goal status: INITIAL  5.  *** Baseline:  Goal status: INITIAL  6.  *** Baseline:  Goal status: INITIAL   PLAN:  PT FREQUENCY: 1-2x/week  PT DURATION: 12 weeks  PLANNED INTERVENTIONS: 97164- PT Re-evaluation, 97110-Therapeutic exercises, 97530- Therapeutic activity, 97112- Neuromuscular re-education, 97535- Self Care, 16109- Manual therapy, G0283- Electrical stimulation (unattended), 97012- Traction (mechanical), Patient/Family education, Taping, Dry Needling, Joint mobilization, Spinal mobilization, Cryotherapy, and Moist heat  PLAN FOR NEXT SESSION: Jearld Lesch, PT 06/26/2023, 7:43 AM

## 2023-06-26 NOTE — Telephone Encounter (Signed)
 Copied from CRM [id number]. Topic: Clinical - Request for Lab/Test Order >> Jun 26, 2023 12:27 PM [place] wrote: Reason for CRM: Patient needing to get the MMR shot for a new job, wanting to know if the clinic has it and if she can get scheduled soon for it. Please reach out to Patient  Forwarding message above. Called patient to schedule nurse visit for MMR vaccine; left VM for call back.

## 2023-06-27 NOTE — Addendum Note (Signed)
 Addended by: Alysia Penna L on: 06/27/2023 10:00 AM   Modules accepted: Orders

## 2023-06-29 NOTE — Telephone Encounter (Signed)
 Left a voice message asking to give me a call back at the office. I will try calling again.

## 2023-06-30 NOTE — Telephone Encounter (Signed)
 Left a voice message asking to give me a call back at the office at [phone]. I will try calling again and will send another MyChart message.

## 2023-07-06 NOTE — Telephone Encounter (Signed)
 Called and left a voice message asking to give me a call back at the office at [phone]. I will try calling again.

## 2023-07-21 ENCOUNTER — Ambulatory Visit: Admitting: Radiology

## 2023-07-21 DIAGNOSIS — Z111 Encounter for screening for respiratory tuberculosis: Secondary | ICD-10-CM | POA: Diagnosis not present

## 2023-07-21 NOTE — Progress Notes (Signed)
 Patient was here for PPD skin test injection. She tolerated well with no reaction. She will be back 07/24/2023 for TB skin result

## 2023-07-24 ENCOUNTER — Ambulatory Visit

## 2023-07-24 LAB — TB SKIN TEST
Induration: 0 mm
TB Skin Test: NEGATIVE

## 2023-07-24 NOTE — Progress Notes (Signed)
PPD Reading Note  PPD read and results entered in EpicCare.  Result: 0 mm induration.  Interpretation: Negative  Allergic reaction: no

## 2023-07-25 ENCOUNTER — Encounter: Payer: Self-pay | Admitting: Nurse Practitioner

## 2023-08-27 ENCOUNTER — Emergency Department (HOSPITAL_COMMUNITY)
Admission: EM | Admit: 2023-08-27 | Discharge: 2023-08-27 | Disposition: A | Discharge status: Home / Self Care | Attending: Emergency Medicine | Admitting: Emergency Medicine

## 2023-08-27 ENCOUNTER — Other Ambulatory Visit: Payer: Self-pay

## 2023-08-27 ENCOUNTER — Encounter (HOSPITAL_COMMUNITY): Payer: Self-pay

## 2023-08-27 DIAGNOSIS — R1110 Vomiting, unspecified: Secondary | ICD-10-CM | POA: Diagnosis not present

## 2023-08-27 DIAGNOSIS — R197 Diarrhea, unspecified: Secondary | ICD-10-CM | POA: Insufficient documentation

## 2023-08-27 DIAGNOSIS — R519 Headache, unspecified: Secondary | ICD-10-CM | POA: Insufficient documentation

## 2023-08-27 LAB — CBC WITH DIFFERENTIAL/PLATELET
Abs Immature Granulocytes: 0.05 10*3/uL (ref 0.00–0.07)
Basophils Absolute: 0 10*3/uL (ref 0.0–0.1)
Basophils Relative: 0 %
Eosinophils Absolute: 0 10*3/uL (ref 0.0–0.5)
Eosinophils Relative: 0 %
HCT: 37.5 % (ref 36.0–46.0)
Hemoglobin: 12.5 g/dL (ref 12.0–15.0)
Immature Granulocytes: 0 %
Lymphocytes Relative: 18 %
Lymphs Abs: 2 10*3/uL (ref 0.7–4.0)
MCH: 30.2 pg (ref 26.0–34.0)
MCHC: 33.3 g/dL (ref 30.0–36.0)
MCV: 90.6 fL (ref 80.0–100.0)
Monocytes Absolute: 0.3 10*3/uL (ref 0.1–1.0)
Monocytes Relative: 3 %
Neutro Abs: 8.9 10*3/uL — ABNORMAL HIGH (ref 1.7–7.7)
Neutrophils Relative %: 79 %
Platelets: 264 10*3/uL (ref 150–400)
RBC: 4.14 MIL/uL (ref 3.87–5.11)
RDW: 13.4 % (ref 11.5–15.5)
WBC: 11.4 10*3/uL — ABNORMAL HIGH (ref 4.0–10.5)
nRBC: 0 % (ref 0.0–0.2)

## 2023-08-27 LAB — COMPREHENSIVE METABOLIC PANEL WITH GFR
ALT: 53 U/L — ABNORMAL HIGH (ref 0–44)
AST: 28 U/L (ref 15–41)
Albumin: 4.1 g/dL (ref 3.5–5.0)
Alkaline Phosphatase: 70 U/L (ref 38–126)
Anion gap: 8 (ref 5–15)
BUN: 10 mg/dL (ref 6–20)
CO2: 25 mmol/L (ref 22–32)
Calcium: 9 mg/dL (ref 8.9–10.3)
Chloride: 106 mmol/L (ref 98–111)
Creatinine, Ser: 0.77 mg/dL (ref 0.44–1.00)
GFR, Estimated: >60 mL/min (ref >60)
Glucose, Bld: 101 mg/dL — ABNORMAL HIGH (ref 70–99)
Potassium: 3.7 mmol/L (ref 3.5–5.1)
Sodium: 139 mmol/L (ref 135–145)
Total Bilirubin: 0.5 mg/dL (ref 0.0–1.2)
Total Protein: 7.2 g/dL (ref 6.5–8.1)

## 2023-08-27 LAB — LIPASE, BLOOD: Lipase: 36 U/L (ref 11–51)

## 2023-08-27 LAB — RESP PANEL BY RT-PCR (RSV, FLU A&B, COVID)  RVPGX2
Influenza A by PCR: NEGATIVE
Influenza B by PCR: NEGATIVE
Resp Syncytial Virus by PCR: NEGATIVE
SARS Coronavirus 2 by RT PCR: NEGATIVE

## 2023-08-27 LAB — TROPONIN I (HIGH SENSITIVITY): Troponin I (High Sensitivity): 4 ng/L (ref <18)

## 2023-08-27 MED ORDER — PROMETHAZINE HCL 25 MG PO TABS: 25.0000 mg | ORAL_TABLET | Freq: Four times a day (QID) | ORAL | 0 refills | Status: DC | PRN

## 2023-08-27 NOTE — ED Provider Notes (Signed)
 [place] EMERGENCY DEPARTMENT AT [facility] Provider Note   CSN: [id number] Arrival date & time: 08/27/23  0127     History  Chief Complaint  Patient presents with   Migraine    Shannon Marks is a 43 y.o. female.  The history is provided by the patient.  Migraine  Shannon Marks is a 43 y.o. female who presents to the Emergency Department complaining of headache.  She presents to the emergency department for evaluation of migraine headache that started at 10 AM.  Pain is located behind her eyes.  She did vomit twice.  Overall her symptoms are improving.  No fever.  She did have a loose stool this morning that she feels is unrelated.  She did take ibuprofen  for pain.  She has no known medical problems and takes no routine medications.      Home Medications Prior to Admission medications   Medication Sig Start Date End Date Taking? Authorizing Provider  promethazine  (PHENERGAN ) 25 MG tablet Take 1 tablet (25 mg total) by mouth every 6 (six) hours as needed for nausea or vomiting. 08/27/23  Yes Kelsey Patricia, MD  albuterol  (VENTOLIN  HFA) 108 [phone] Base) MCG/ACT inhaler Inhale 1-2 puffs into the lungs every 6 (six) hours as needed for wheezing or shortness of breath. 06/12/23   Tonna Frederic, MD  butalbital -acetaminophen -caffeine  (FIORICET) 50-325-40 MG tablet Take 1-2 tablets by mouth every 6 (six) hours as needed for up to 365 doses for headache. Patient not taking: Reported on 06/12/2023 02/18/23   Onetha Bile, MD  ibuprofen  (ADVIL ) 800 MG tablet Take 1 tablet (800 mg total) by mouth every 8 (eight) hours as needed (pain). 06/01/23   Catheryn Cluck, MD  lidocaine  (LIDODERM ) 5 % Place 1 patch onto the skin daily. Remove & Discard patch within 12 hours or as directed by MD Patient not taking: Reported on 06/12/2023 06/09/23   Nche, Connye Delaine, NP  methocarbamol  (ROBAXIN ) 500 MG tablet Take 1-2 tablets (500-1,000 mg total) by mouth every 8 (eight)  hours as needed for muscle spasms. Patient not taking: Reported on 06/12/2023 06/01/23   Catheryn Cluck, MD  promethazine -dextromethorphan (PROMETHAZINE -DM) 6.25-15 MG/5ML syrup Take 5 mLs by mouth 4 (four) times daily as needed for cough. 06/12/23   Tonna Frederic, MD      Allergies    Patient has no known allergies.    Review of Systems   Review of Systems  All other systems reviewed and are negative.   Physical Exam Updated Vital Signs BP 137/87 (BP Location: Left Arm)   Pulse 61   Temp 98.3 F (36.8 C) (Oral)   Resp 16   Ht 5\' 2"  (1.575 m)   Wt 59.4 kg   SpO2 100%   BMI 23.96 kg/m  Physical Exam Vitals and nursing note reviewed.  Constitutional:      Appearance: She is well-developed.  HENT:     Head: Normocephalic and atraumatic.  Cardiovascular:     Rate and Rhythm: Normal rate and regular rhythm.     Heart sounds: No murmur heard. Pulmonary:     Effort: Pulmonary effort is normal. No respiratory distress.     Breath sounds: Normal breath sounds.  Abdominal:     Palpations: Abdomen is soft.     Tenderness: There is no abdominal tenderness. There is no guarding or rebound.  Musculoskeletal:        General: No tenderness.  Skin:    General: Skin is warm  and dry.  Neurological:     Mental Status: She is alert and oriented to person, place, and time.     Comments: No asymmetry of facial movements, visual fields grossly intact, 5/5 strength in all four extremities with sensation to light touch intact in all four extremities.   Psychiatric:        Behavior: Behavior normal.     ED Results / Procedures / Treatments   Labs (all labs ordered are listed, but only abnormal results are displayed) Labs Reviewed  CBC WITH DIFFERENTIAL/PLATELET - Abnormal; Notable for the following components:      Result Value   WBC 11.4 (*)    Neutro Abs 8.9 (*)    All other components within normal limits  COMPREHENSIVE METABOLIC PANEL WITH GFR - Abnormal; Notable for  the following components:   Glucose, Bld 101 (*)    ALT 53 (*)    All other components within normal limits  RESP PANEL BY RT-PCR (RSV, FLU A&B, COVID)  [id number]  LIPASE, BLOOD  TROPONIN I (HIGH SENSITIVITY)    EKG None  Radiology No results found.  Procedures Procedures    Medications Ordered in ED Medications - No data to display  ED Course/ Medical Decision Making/ A&P                                 Medical Decision Making Risk Prescription drug management.   Patient with history of migraines here for evaluation of headache similar to prior migraines.  Pain is resolved at time of evaluation in the emergency department.  Labs are reassuring.  Exam is not consistent with meningitis, dural sinus thrombosis or subarachnoid hemorrhage.  Feel she is stable for discharge home with outpatient follow-up and return precautions.        Final Clinical Impression(s) / ED Diagnoses Final diagnoses:  Bad headache    Rx / DC Orders ED Discharge Orders          Ordered    promethazine  (PHENERGAN ) 25 MG tablet  Every 6 hours PRN        08/27/23 0316              Kelsey Patricia, MD 08/27/23 [phone]

## 2023-08-27 NOTE — ED Triage Notes (Addendum)
 Pt states that she woke up this am around 1000 with a severe migraine that's gotten worse throughout the day. Pt also endorses burning sensations to mid chest, hematemesis, nausea, diarrhea but denies abdominal pain,fevers, chills lightheadedness, or dizziness. Currently on menstrual cycle.

## 2023-08-27 NOTE — ED Notes (Addendum)
 Pt requested no visitors and that her chart be private

## 2023-08-30 ENCOUNTER — Telehealth: Payer: Self-pay

## 2023-08-30 NOTE — Transitions of Care (Post Inpatient/ED Visit) (Signed)
   08/30/2023  Name: Shannon Marks MRN: [id number] DOB: [birth year]  Today's TOC FU Call Status: Today's TOC FU Call Status:: Unsuccessful Call (1st Attempt) Unsuccessful Call (1st Attempt) Date: 08/30/23  Attempted to reach the patient regarding the most recent Inpatient/ED visit.  Follow Up Plan: Additional outreach attempts will be made to reach the patient to complete the Transitions of Care (Post Inpatient/ED visit) call.   Signature  Kirby Peoples, RMA

## 2023-09-05 ENCOUNTER — Encounter: Payer: Self-pay | Admitting: Nurse Practitioner

## 2023-09-05 ENCOUNTER — Ambulatory Visit (INDEPENDENT_AMBULATORY_CARE_PROVIDER_SITE_OTHER): Admitting: Nurse Practitioner

## 2023-09-05 VITALS — BP 124/80 | HR 68 | Temp 98.6°F | Ht 62.0 in | Wt 135.0 lb

## 2023-09-05 DIAGNOSIS — M5134 Other intervertebral disc degeneration, thoracic region: Secondary | ICD-10-CM | POA: Diagnosis not present

## 2023-09-05 DIAGNOSIS — G43009 Migraine without aura, not intractable, without status migrainosus: Secondary | ICD-10-CM | POA: Diagnosis not present

## 2023-09-05 DIAGNOSIS — M5030 Other cervical disc degeneration, unspecified cervical region: Secondary | ICD-10-CM

## 2023-09-05 DIAGNOSIS — M792 Neuralgia and neuritis, unspecified: Secondary | ICD-10-CM

## 2023-09-05 DIAGNOSIS — R0789 Other chest pain: Secondary | ICD-10-CM

## 2023-09-05 DIAGNOSIS — R0602 Shortness of breath: Secondary | ICD-10-CM

## 2023-09-05 MED ORDER — AIRSUPRA 90-80 MCG/ACT IN AERO: 2.0000 | INHALATION_SPRAY | Freq: Four times a day (QID) | RESPIRATORY_TRACT | 0 refills | Status: AC | PRN

## 2023-09-05 MED ORDER — TIZANIDINE HCL 4 MG PO CAPS: 4.0000 mg | ORAL_CAPSULE | Freq: Three times a day (TID) | ORAL | 0 refills | Status: DC | PRN

## 2023-09-05 MED ORDER — METHOCARBAMOL 500 MG PO TABS: 500.0000 mg | ORAL_TABLET | Freq: Three times a day (TID) | ORAL | 0 refills | Status: DC | PRN

## 2023-09-05 MED ORDER — AMITRIPTYLINE HCL 10 MG PO TABS: 10.0000 mg | ORAL_TABLET | Freq: Every day | ORAL | 5 refills | Status: DC

## 2023-09-05 MED ORDER — RIZATRIPTAN BENZOATE 5 MG PO TABS: 5.0000 mg | ORAL_TABLET | ORAL | 0 refills | Status: DC | PRN

## 2023-09-05 MED ORDER — KETOROLAC TROMETHAMINE 30 MG/ML IJ SOLN
30.0000 mg | Freq: Once | INTRAMUSCULAR | Status: AC
  Administered 2023-09-05: 30 mg via INTRAMUSCULAR

## 2023-09-05 MED ORDER — PROMETHAZINE HCL 25 MG PO TABS: 25.0000 mg | ORAL_TABLET | Freq: Four times a day (QID) | ORAL | 0 refills | Status: AC | PRN

## 2023-09-05 NOTE — Assessment & Plan Note (Addendum)
 Chronic SOB with any exertion. Relief use of Albuterol  2-3x/day No cough, no wheezing, no CP, no diaphoresis Unable to complete spirometry in office: reports lightheadedness. CXR 05/2023: normal ECG 05/2023: NSR compared to previous ECG in 2024  Sent airsupra 2puff every 6hrs prn Switch to symbicort if high copay F/up in 56month

## 2023-09-05 NOTE — Patient Instructions (Addendum)
 Start amitriptyline 10mg  at bedtime Use maxalt and promethazine  as needed for migraine headache. Use robaxin  as needed for muscle spasm. This may cause drowsiness. Schedule appointment with PT.  Sent Airsupra to use as needed for Shortness of breath. F/up in 20month

## 2023-09-05 NOTE — Assessment & Plan Note (Signed)
 Start elavil Continue robaxin  and NSAID prn Advised to schedule PT referral

## 2023-09-05 NOTE — Assessment & Plan Note (Signed)
 Normal CXR DG thoracic spine: No evidence of a thoracic vertebral compression fracture. Mild-to-moderate T4-T5 disc space narrowing. No more than mild disc space narrowing at the remaining levels. Dextrocurvature of the thoracic spine.  Normal ROM and no weakness. Minimal improvement with ibuprofen  and robaxin  Start elavil Use robaxin  and NSAID prn Entered PT referral

## 2023-09-05 NOTE — Assessment & Plan Note (Signed)
 Acute on chronic headache, led to ED visit on 08/27/2023. Described headache as daily throbbing behind eyes, waxing and waning, associated with nausea, and photosensitivity. Has some relief with ibuprofen  400mg  BID prn. CT head 01/2023: normal  Sent elavil 10mg  at hs, maxalt 5mg  prn F/up in 67month

## 2023-09-05 NOTE — Progress Notes (Signed)
 Established Patient Visit  Patient: Shannon Marks   DOB: Sep 23, 1980   43 y.o. Female  MRN: [id number] Visit Date: 09/05/2023  Subjective:    Chief Complaint  Patient presents with   Hospital Follow-Up     Severe headache and chest pain left side around to back then down the side intermittent arm numbness, and tingling  Throbbing behind left eye  Vomiting "blood"     Medication Management    Refills needed for inhaler, and medications prescribed by hospital provider      HPI DDD (degenerative disc disease), thoracic Normal CXR DG thoracic spine: No evidence of a thoracic vertebral compression fracture. Mild-to-moderate T4-T5 disc space narrowing. No more than mild disc space narrowing at the remaining levels. Dextrocurvature of the thoracic spine.  Normal ROM and no weakness. Minimal improvement with ibuprofen  and robaxin  Start elavil Use robaxin  and NSAID prn Entered PT referral  DDD (degenerative disc disease), cervical Start elavil Continue robaxin  and NSAID prn Advised to schedule PT referral  SOB (shortness of breath) on exertion Chronic SOB with any exertion. Relief use of Albuterol  2-3x/day No cough, no wheezing, no CP, no diaphoresis Unable to complete spirometry in office: reports lightheadedness. CXR 05/2023: normal ECG 05/2023: NSR compared to previous ECG in 2024  Sent airsupra 2puff every 6hrs prn Switch to symbicort if high copay F/up in 98month  Migraine without aura and without status migrainosus, not intractable Acute on chronic headache, led to ED visit on 08/27/2023. Described headache as daily throbbing behind eyes, waxing and waning, associated with nausea, and photosensitivity. Has some relief with ibuprofen  400mg  BID prn. CT head 01/2023: normal  Sent elavil 10mg  at hs, maxalt 5mg  prn F/up in 98month  Reviewed medical, surgical, and social history today  Medications: Outpatient Medications Prior to Visit  Medication Sig    [DISCONTINUED] albuterol  (VENTOLIN  HFA) 108 (90 Base) MCG/ACT inhaler Inhale 1-2 puffs into the lungs every 6 (six) hours as needed for wheezing or shortness of breath.   [DISCONTINUED] butalbital -acetaminophen -caffeine  (FIORICET) 50-325-40 MG tablet Take 1-2 tablets by mouth every 6 (six) hours as needed for up to 365 doses for headache. (Patient not taking: Reported on 09/05/2023)   [DISCONTINUED] ibuprofen  (ADVIL ) 800 MG tablet Take 1 tablet (800 mg total) by mouth every 8 (eight) hours as needed (pain). (Patient not taking: Reported on 09/05/2023)   [DISCONTINUED] lidocaine  (LIDODERM ) 5 % Place 1 patch onto the skin daily. Remove & Discard patch within 12 hours or as directed by MD (Patient not taking: Reported on 09/05/2023)   [DISCONTINUED] methocarbamol  (ROBAXIN ) 500 MG tablet Take 1-2 tablets (500-1,000 mg total) by mouth every 8 (eight) hours as needed for muscle spasms. (Patient not taking: Reported on 09/05/2023)   [DISCONTINUED] promethazine  (PHENERGAN ) 25 MG tablet Take 1 tablet (25 mg total) by mouth every 6 (six) hours as needed for nausea or vomiting. (Patient not taking: Reported on 09/05/2023)   [DISCONTINUED] promethazine -dextromethorphan (PROMETHAZINE -DM) 6.25-15 MG/5ML syrup Take 5 mLs by mouth 4 (four) times daily as needed for cough. (Patient not taking: Reported on 09/05/2023)   No facility-administered medications prior to visit.   Reviewed past medical and social history.   ROS per HPI above      Objective:  BP 124/80 (BP Location: Left Arm, Patient Position: Sitting, Cuff Size: Normal)   Pulse 68   Temp 98.6 F (37 C) (Temporal)   Ht 5\' 2"  (1.575 m)  Wt 135 lb (61.2 kg)   SpO2 99%   BMI 24.69 kg/m      Physical Exam Vitals and nursing note reviewed.  Cardiovascular:     Rate and Rhythm: Normal rate and regular rhythm.     Pulses: Normal pulses.     Heart sounds: Normal heart sounds.  Pulmonary:     Effort: Pulmonary effort is normal.     Breath sounds: Normal  breath sounds.  Musculoskeletal:     Left shoulder: Tenderness present. No swelling, deformity, effusion or crepitus. Normal range of motion. Normal strength. Normal pulse.     Left upper arm: Normal.     Left elbow: Normal.     Left forearm: Normal.     Left wrist: Normal.     Left hand: Normal.     Cervical back: Spasms and tenderness present. No rigidity or torticollis. Pain with movement present. Normal range of motion.     Thoracic back: Spasms and tenderness present. Normal range of motion.  Skin:    Findings: No erythema or rash.  Neurological:     Mental Status: She is alert and oriented to person, place, and time.     No results found for any visits on 09/05/23.    Assessment & Plan:    Problem List Items Addressed This Visit     DDD (degenerative disc disease), cervical   Start elavil Continue robaxin  and NSAID prn Advised to schedule PT referral      Relevant Medications   amitriptyline (ELAVIL) 10 MG tablet   Other Relevant Orders   Ambulatory referral to Physical Therapy   DDD (degenerative disc disease), thoracic   Normal CXR DG thoracic spine: No evidence of a thoracic vertebral compression fracture. Mild-to-moderate T4-T5 disc space narrowing. No more than mild disc space narrowing at the remaining levels. Dextrocurvature of the thoracic spine.  Normal ROM and no weakness. Minimal improvement with ibuprofen  and robaxin  Start elavil Use robaxin  and NSAID prn Entered PT referral      Relevant Medications   amitriptyline (ELAVIL) 10 MG tablet   Other Relevant Orders   Ambulatory referral to Physical Therapy   Left-sided chest wall pain   Relevant Medications   methocarbamol  (ROBAXIN ) 500 MG tablet   Other Relevant Orders   Ambulatory referral to Physical Therapy   Migraine without aura and without status migrainosus, not intractable - Primary   Acute on chronic headache, led to ED visit on 08/27/2023. Described headache as daily throbbing behind eyes,  waxing and waning, associated with nausea, and photosensitivity. Has some relief with ibuprofen  400mg  BID prn. CT head 01/2023: normal  Sent elavil 10mg  at hs, maxalt 5mg  prn F/up in 644month      Relevant Medications   rizatriptan (MAXALT) 5 MG tablet   promethazine  (PHENERGAN ) 25 MG tablet   amitriptyline (ELAVIL) 10 MG tablet   methocarbamol  (ROBAXIN ) 500 MG tablet   Radicular pain in left arm   Relevant Orders   Ambulatory referral to Physical Therapy   SOB (shortness of breath) on exertion   Chronic SOB with any exertion. Relief use of Albuterol  2-3x/day No cough, no wheezing, no CP, no diaphoresis Unable to complete spirometry in office: reports lightheadedness. CXR 05/2023: normal ECG 05/2023: NSR compared to previous ECG in 2024  Sent airsupra 2puff every 6hrs prn Switch to symbicort if high copay F/up in 644month      Relevant Medications   Albuterol -Budesonide (AIRSUPRA) 90-80 MCG/ACT AERO   Return in about 4 weeks (  around 10/03/2023) for SOB, migraine, radicular pain (get spirometry).     Kathrene Parents, NP

## 2023-09-11 ENCOUNTER — Ambulatory Visit: Admitting: Internal Medicine

## 2023-10-02 ENCOUNTER — Other Ambulatory Visit: Payer: Self-pay | Admitting: Medical Genetics

## 2023-10-02 ENCOUNTER — Ambulatory Visit: Admitting: Physical Therapy

## 2023-10-02 NOTE — Therapy (Incomplete)
 OUTPATIENT PHYSICAL THERAPY CERVICAL EVALUATION   Patient Name: Shannon Marks MRN: [id number] DOB:1980/09/05, 43 y.o., female Today's Date: 10/02/2023  END OF SESSION:   Past Medical History:  Diagnosis Date   Marital conflict 06/25/2021   Past Surgical History:  Procedure Laterality Date   TUBAL LIGATION     Patient Active Problem List   Diagnosis Date Noted   Migraine without aura and without status migrainosus, not intractable 09/05/2023   Left-sided chest wall pain 09/05/2023   Radicular pain in left arm 09/05/2023   SOB (shortness of breath) on exertion 09/05/2023   DDD (degenerative disc disease), thoracic 06/09/2023   DDD (degenerative disc disease), cervical 06/09/2023   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  Rationale for Evaluation and Treatment: Rehabilitation  ONSET DATE: 09/05/23  SUBJECTIVE:                                                                                                                                                                                                         SUBJECTIVE STATEMENT: *** Hand dominance: {MISC; OT HAND [phone]}  PERTINENT HISTORY:  ***  PAIN:  Are you having pain? Yes: NPRS scale: *** Pain location: *** Pain description: *** Aggravating factors: *** Relieving factors: ***  PRECAUTIONS: {Therapy precautions:24002}  RED FLAGS: {PT Red Flags:29287}     WEIGHT BEARING RESTRICTIONS: No  FALLS:  Has patient fallen in last 6 months? {fallsyesno:27318}  LIVING ENVIRONMENT: Lives with: lives with their family Lives in: House/apartment Stairs: {opstairs:27293} Has following equipment at home: {Assistive devices:23999}  OCCUPATION: ***  PLOF:  {PLOF:24004}  PATIENT GOALS: ***  NEXT MD VISIT: ***  OBJECTIVE:  Note: Objective measures were completed at Evaluation unless otherwise noted.  DIAGNOSTIC FINDINGS:  ***  PATIENT SURVEYS:  {rehab surveys:24030}  COGNITION: Overall cognitive status: {cognition:24006}  SENSATION: {sensation:27233}  POSTURE: {posture:25561}  PALPATION: ***   CERVICAL ROM:   Active ROM A/PROM (deg) eval  Flexion   Extension   Right lateral flexion   Left lateral flexion   Right rotation   Left rotation    (Blank rows = not tested)  UPPER EXTREMITY ROM:  Active ROM Right eval Left eval  Shoulder flexion    Shoulder extension    Shoulder abduction    Shoulder adduction    Shoulder extension    Shoulder internal rotation  Shoulder external rotation    Elbow flexion    Elbow extension    Wrist flexion    Wrist extension    Wrist ulnar deviation    Wrist radial deviation    Wrist pronation    Wrist supination     (Blank rows = not tested)  UPPER EXTREMITY MMT:  MMT Right eval Left eval  Shoulder flexion    Shoulder extension    Shoulder abduction    Shoulder adduction    Shoulder extension    Shoulder internal rotation    Shoulder external rotation    Middle trapezius    Lower trapezius    Elbow flexion    Elbow extension    Wrist flexion    Wrist extension    Wrist ulnar deviation    Wrist radial deviation    Wrist pronation    Wrist supination    Grip strength     (Blank rows = not tested)  CERVICAL SPECIAL TESTS:  {Cervical special tests:25246}  FUNCTIONAL TESTS:  {Functional tests:24029}  TREATMENT DATE: ***                                                                                                                                 PATIENT EDUCATION:  Education details: *** Person educated: {Person educated:25204} Education method: {Education Method:25205} Education comprehension: {Education Comprehension:25206}  HOME EXERCISE  PROGRAM: ***  ASSESSMENT:  CLINICAL IMPRESSION: Patient is a 43 y.o. female who was seen today for physical therapy evaluation and treatment for ***.   OBJECTIVE IMPAIRMENTS: decreased endurance, decreased ROM, decreased strength, increased fascial restrictions, increased muscle spasms, impaired flexibility, impaired UE functional use, impaired vision/preception, improper body mechanics, postural dysfunction, and pain.   REHAB POTENTIAL: Good  CLINICAL DECISION MAKING: Stable/uncomplicated  EVALUATION COMPLEXITY: Low   GOALS: Goals reviewed with patient? Yes  SHORT TERM GOALS: Target date: 10/16/23  Independent with initial HEP Baseline:  Goal status: INITIAL  LONG TERM GOALS: Target date: 01/02/24  Understand posture and body mechanics Baseline:  Goal status: INITIAL  2.  *** Baseline:  Goal status: INITIAL  3.  *** Baseline:  Goal status: INITIAL  4.  *** Baseline:  Goal status: INITIAL  5.  *** Baseline:  Goal status: INITIAL  6.  *** Baseline:  Goal status: INITIAL   PLAN:  PT FREQUENCY: 1-2x/week  PT DURATION: 12 weeks  PLANNED INTERVENTIONS: 97164- PT Re-evaluation, 97110-Therapeutic exercises, 97530- Therapeutic activity, 97112- Neuromuscular re-education, 97535- Self Care, 91478- Manual therapy, G0283- Electrical stimulation (unattended), 97035- Ultrasound, 29562- Traction (mechanical), Patient/Family education, Taping, Dry Needling, Joint mobilization, Cryotherapy, and Moist heat  PLAN FOR NEXT SESSION: Hollis Lurie, PT 10/02/2023, 8:17 AM

## 2023-10-03 ENCOUNTER — Telehealth: Payer: Self-pay

## 2023-10-03 ENCOUNTER — Encounter: Payer: Self-pay | Admitting: Nurse Practitioner

## 2023-10-03 ENCOUNTER — Ambulatory Visit (INDEPENDENT_AMBULATORY_CARE_PROVIDER_SITE_OTHER): Admitting: Nurse Practitioner

## 2023-10-03 ENCOUNTER — Other Ambulatory Visit (HOSPITAL_COMMUNITY): Payer: Self-pay

## 2023-10-03 VITALS — BP 128/82 | HR 86 | Temp 97.7°F | Ht 62.0 in | Wt 135.0 lb

## 2023-10-03 DIAGNOSIS — M5030 Other cervical disc degeneration, unspecified cervical region: Secondary | ICD-10-CM

## 2023-10-03 DIAGNOSIS — M5134 Other intervertebral disc degeneration, thoracic region: Secondary | ICD-10-CM

## 2023-10-03 DIAGNOSIS — G43009 Migraine without aura, not intractable, without status migrainosus: Secondary | ICD-10-CM

## 2023-10-03 MED ORDER — METHOCARBAMOL 750 MG PO TABS
750.0000 mg | ORAL_TABLET | Freq: Every evening | ORAL | 2 refills | Status: DC | PRN
Start: 2023-10-03 — End: 2023-10-04

## 2023-10-03 MED ORDER — NURTEC 75 MG PO TBDP
1.0000 | ORAL_TABLET | ORAL | 0 refills | Status: DC | PRN
Start: 2023-10-03 — End: 2023-12-22

## 2023-10-03 MED ORDER — IBUPROFEN 600 MG PO TABS: 600.0000 mg | ORAL_TABLET | Freq: Every day | ORAL | 2 refills | Status: AC | PRN

## 2023-10-03 NOTE — Progress Notes (Signed)
 Established Patient Visit  Patient: Shannon Marks   DOB: 1981/01/31   43 y.o. Female  MRN: [id number] Visit Date: 10/03/2023  Subjective:     Chief Complaint  Patient presents with   Follow-up    4 week f/u for migraine, shoulder pain (left),     HPI DDD (degenerative disc disease), cervical Unable to tolerate elavil  10mg -daytime somnolence Current use of ibuprofen  and robaxin  daily Pending appointment with PT  DDD (degenerative disc disease), thoracic Unable to tolerate elavil  10mg -daytime somnolence Current use of ibuprofen  and robaxin  daily Pending appointment with PT  Migraine without aura and without status migrainosus, not intractable Unable to tolerate elavil -daytime somnolence No improvement with maxalt  10mg .  D/c elavil  and maxalt  Sent nurtec 75mg  Consider use of gabapentin? F/up in 2months or sooner if needed   Reviewed medical, surgical, and social history today  Medications: Outpatient Medications Prior to Visit  Medication Sig Note   Albuterol -Budesonide (AIRSUPRA ) 90-80 MCG/ACT AERO Inhale 2 Inhalations into the lungs every 6 (six) hours as needed.    promethazine  (PHENERGAN ) 25 MG tablet Take 1 tablet (25 mg total) by mouth every 6 (six) hours as needed for nausea or vomiting.    [DISCONTINUED] amitriptyline  (ELAVIL ) 10 MG tablet Take 1 tablet (10 mg total) by mouth at bedtime. 10/03/2023: sedation   [DISCONTINUED] methocarbamol  (ROBAXIN ) 500 MG tablet Take 1 tablet (500 mg total) by mouth every 8 (eight) hours as needed for muscle spasms.    [DISCONTINUED] rizatriptan  (MAXALT ) 5 MG tablet Take 1 tablet (5 mg total) by mouth as needed for migraine. May repeat in 2 hours if needed    [DISCONTINUED] tiZANidine  (ZANAFLEX ) 4 MG capsule Take 4 mg by mouth 3 (three) times daily.    No facility-administered medications prior to visit.   Reviewed past medical and social history.   ROS per HPI above      Objective:  BP 128/82 (BP Location:  Left Arm, Patient Position: Sitting, Cuff Size: Large)   Pulse 86   Temp 97.7 F (36.5 C) (Temporal)   Ht 5\' 2"  (1.575 m)   Wt 135 lb (61.2 kg)   SpO2 100%   BMI 24.69 kg/m      Physical Exam Vitals and nursing note reviewed.  Cardiovascular:     Rate and Rhythm: Normal rate.     Pulses: Normal pulses.  Pulmonary:     Effort: Pulmonary effort is normal.  Musculoskeletal:     Cervical back: Tenderness present. No rigidity.  Neurological:     Mental Status: She is alert and oriented to person, place, and time.     No results found for any visits on 10/03/23.    Assessment & Plan:    Problem List Items Addressed This Visit     DDD (degenerative disc disease), cervical   Unable to tolerate elavil  10mg -daytime somnolence Current use of ibuprofen  and robaxin  daily Pending appointment with PT      Relevant Medications   methocarbamol  (ROBAXIN ) 750 MG tablet   ibuprofen  (ADVIL ) 600 MG tablet   DDD (degenerative disc disease), thoracic   Unable to tolerate elavil  10mg -daytime somnolence Current use of ibuprofen  and robaxin  daily Pending appointment with PT      Relevant Medications   methocarbamol  (ROBAXIN ) 750 MG tablet   ibuprofen  (ADVIL ) 600 MG tablet   Migraine without aura and without status migrainosus, not intractable - Primary   Unable to tolerate elavil -daytime  somnolence No improvement with maxalt  10mg .  D/c elavil  and maxalt  Sent nurtec 75mg  Consider use of gabapentin? F/up in 2months or sooner if needed      Relevant Medications   methocarbamol  (ROBAXIN ) 750 MG tablet   Rimegepant Sulfate (NURTEC) 75 MG TBDP   ibuprofen  (ADVIL ) 600 MG tablet   Return in about 2 months (around 12/03/2023) for migraine and chronic pain.     Kathrene Parents, NP

## 2023-10-03 NOTE — Telephone Encounter (Signed)
 Pharmacy Patient Advocate Encounter   Received notification from Pt Calls Messages that prior authorization for Methocarbamol  750MG  tablets is required/requested.   Insurance verification completed.   The patient is insured through [facility] .   Per test claim: PA required; PA submitted to above mentioned insurance via CoverMyMeds Key/confirmation #/EOC (Key: [id number])   Status is pending

## 2023-10-03 NOTE — Telephone Encounter (Signed)
 Patient is needing a pa for her medication

## 2023-10-03 NOTE — Assessment & Plan Note (Signed)
 Unable to tolerate elavil  10mg -daytime somnolence Current use of ibuprofen  and robaxin  daily Pending appointment with PT

## 2023-10-03 NOTE — Telephone Encounter (Signed)
 Pharmacy Patient Advocate Encounter   Received notification from CoverMyMeds that prior authorization for Nurtec 75MG  dispersible tablets is required/requested.   Insurance verification completed.   The patient is insured through [facility] .   Per test claim: PA required; PA submitted to above mentioned insurance via CoverMyMeds Key/confirmation #/EOC [id number] Status is pending

## 2023-10-03 NOTE — Assessment & Plan Note (Signed)
 Unable to tolerate elavil -daytime somnolence No improvement with maxalt  10mg .  D/c elavil  and maxalt  Sent nurtec 75mg  Consider use of gabapentin? F/up in 2months or sooner if needed

## 2023-10-03 NOTE — Patient Instructions (Signed)
 Stop maxalt  and amitriptyline  Use nurtec every 72hrs as needed for migriane headache Maintain other med doses

## 2023-10-03 NOTE — Telephone Encounter (Signed)
 Copied from CRM [id number]. Topic: Clinical - Prescription Issue >> Oct 03, 2023  2:09 PM Juleen Oakland F wrote: Reason for CRM: Patient calling to let office know that a prior authorization is needed for the methocarbamol  (ROBAXIN ) 750 MG tablet medication. Please call her at [phone] (M) when there is an update.

## 2023-10-04 ENCOUNTER — Other Ambulatory Visit (HOSPITAL_BASED_OUTPATIENT_CLINIC_OR_DEPARTMENT_OTHER): Payer: Self-pay

## 2023-10-04 ENCOUNTER — Other Ambulatory Visit: Payer: Self-pay | Admitting: Nurse Practitioner

## 2023-10-04 ENCOUNTER — Other Ambulatory Visit (HOSPITAL_COMMUNITY): Payer: Self-pay

## 2023-10-04 DIAGNOSIS — R0602 Shortness of breath: Secondary | ICD-10-CM

## 2023-10-04 MED ORDER — METHOCARBAMOL 500 MG PO TABS: 500.0000 mg | ORAL_TABLET | Freq: Every evening | ORAL | 2 refills | Status: AC | PRN

## 2023-10-04 NOTE — Telephone Encounter (Signed)
 Called and informed patient that our prior authorization team is working on the PA needed for her medications and that I will be in contact one I get notification from the PA team. She thanked me for calling and stated she will be looking for a call.

## 2023-10-04 NOTE — Telephone Encounter (Signed)
 Called patient to ask if she needs a refill on the Airsupra  inhaler due to our office receiving a refill request. She stated that she does not need the inhaler or the Robaxin  because she got the Robaxin  yesterday she is just waiting to hear back from us  about the Nurtec. I informed her that I am still waiting to hear back from the PA team about this medication and the PA for it. She thanked me for calling and restated that she does not the inhaler or the Robaxin  she has both already

## 2023-10-04 NOTE — Telephone Encounter (Signed)
 Pharmacy Patient Advocate Encounter  Received notification from [employer] that Prior Authorization for  Methocarbamol  750MG  tablets has been CANCELLED due to P/A NOT NEEDED for Rx. The Rx has been filled as of today 10/04/23

## 2023-10-04 NOTE — Addendum Note (Signed)
 Addended by: Kathrene Parents L on: 10/04/2023 08:06 AM   Modules accepted: Orders

## 2023-10-04 NOTE — Telephone Encounter (Signed)
 Hey DANI,                  Please follow up with the office after receiving a determination. Office has now requested.

## 2023-10-04 NOTE — Telephone Encounter (Unsigned)
 Copied from CRM [id number]. Topic: Clinical - Prescription Issue >> Oct 04, 2023  9:28 AM Howard Macho wrote: Reason for CRM: patient called stating she could not get her medication because it requires a prior authorization for nurtec. Patient stated the pharmacy sent over a request yesterday and have not heard a response yet  CB [phone]

## 2023-10-04 NOTE — Telephone Encounter (Signed)
  Md also switched therapy to 500mg . Nothing further needed.

## 2023-10-06 IMAGING — US US PELVIS COMPLETE WITH TRANSVAGINAL
1 series · 13 of 25 positions shown · non-contrast
Comparison: CT from 02/11/2020.

CLINICAL DATA: Initial evaluation for vaginal bleeding,
dysmenorrhea.

EXAM:
TRANSABDOMINAL AND TRANSVAGINAL ULTRASOUND OF PELVIS
TECHNIQUE: Both transabdominal and transvaginal ultrasound examinations of the
pelvis were performed. Transabdominal technique was performed for
global imaging of the pelvis including uterus, ovaries, adnexal
regions, and pelvic cul-de-sac. It was necessary to proceed with
endovaginal exam following the transabdominal exam to visualize the
endometrium and ovaries.

[Series 1: us pelvis complete with transvaginal · 0.25mm/px · 13 of 55 slices shown]
[im 1/55]
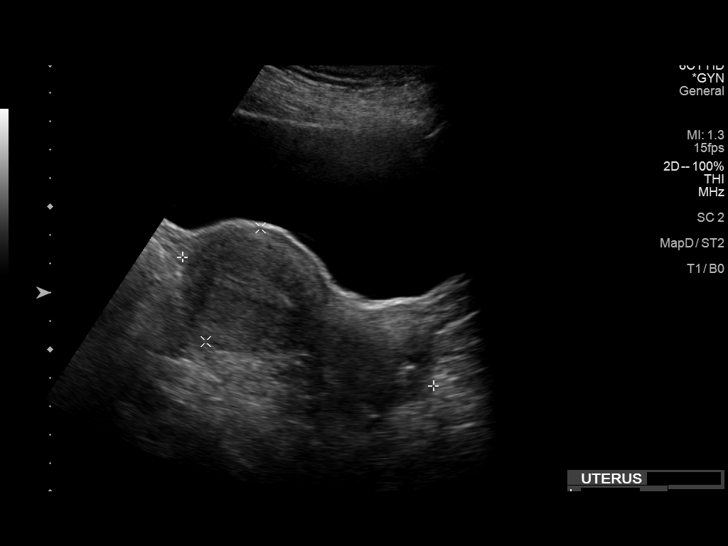
[im 5/55]
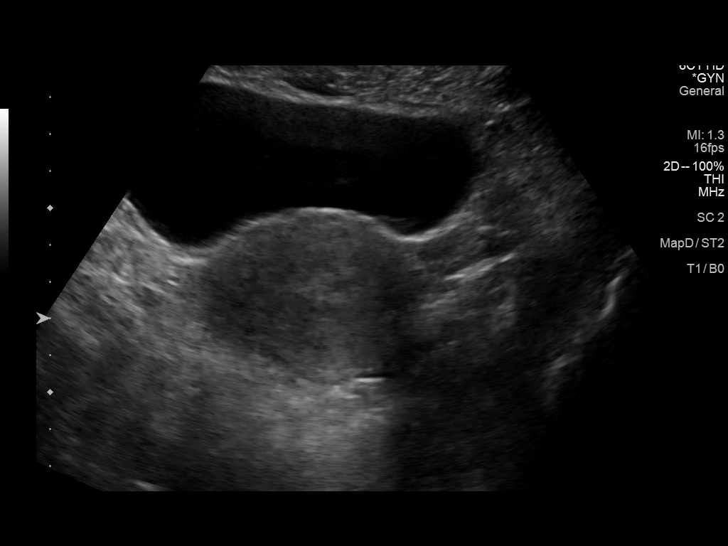
[im 10/55]
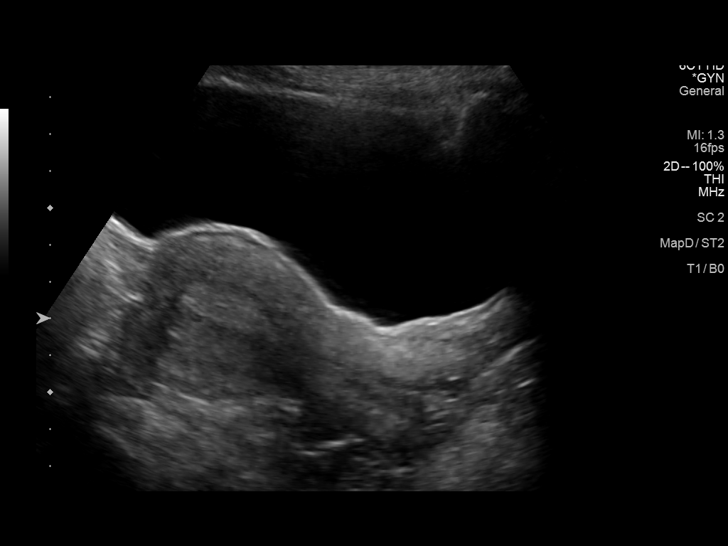
[im 14/55]
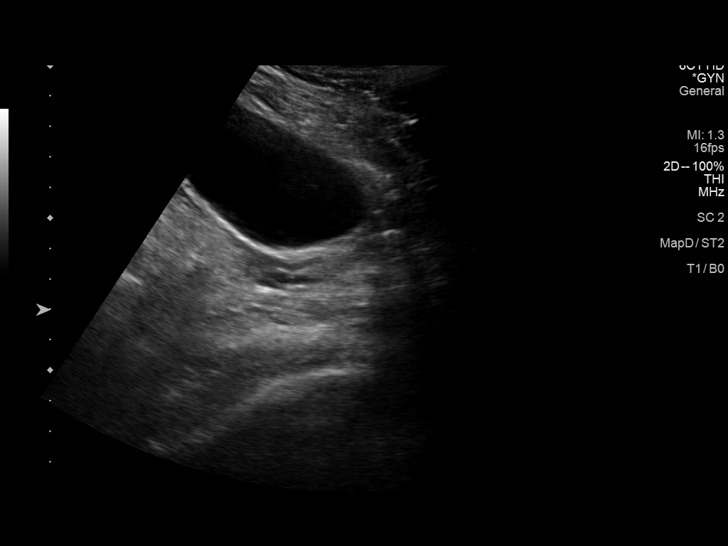
[im 19/55]
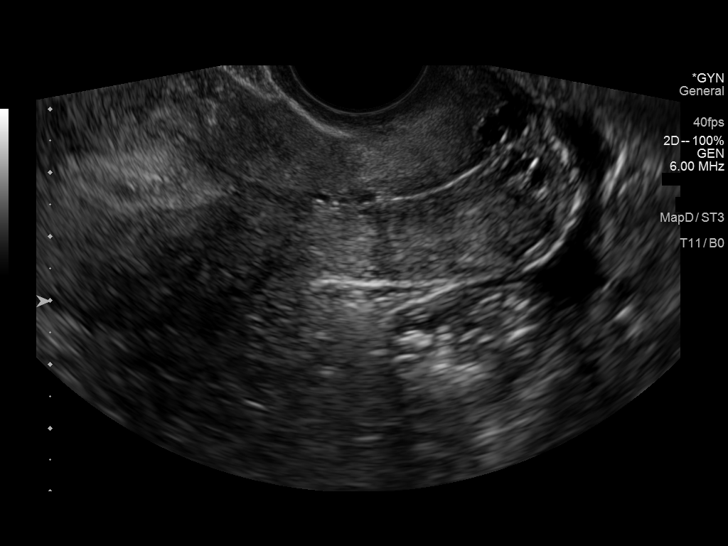
[im 23/55]
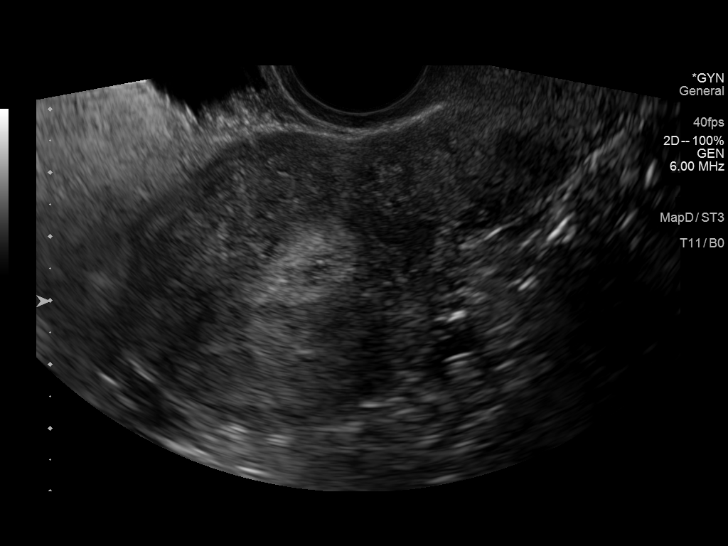
[im 28/55]
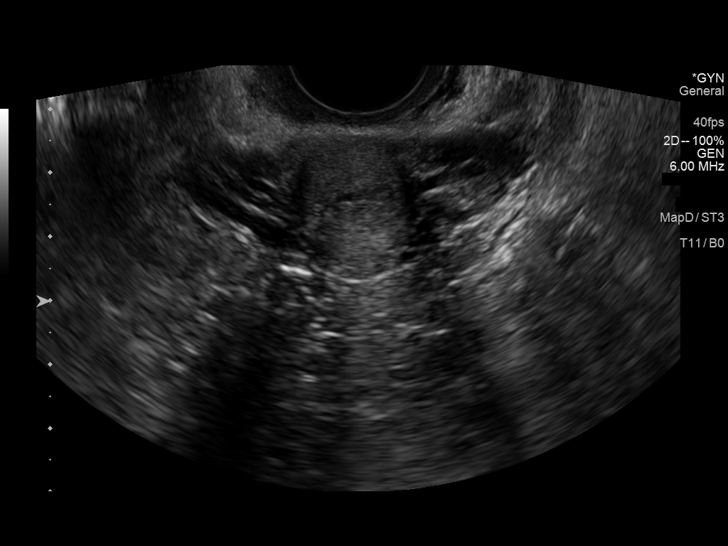
[im 32/55]
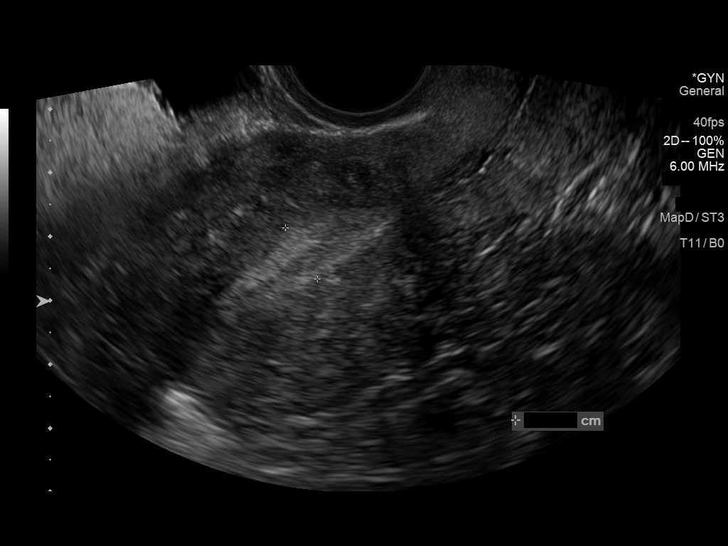
[im 37/55]
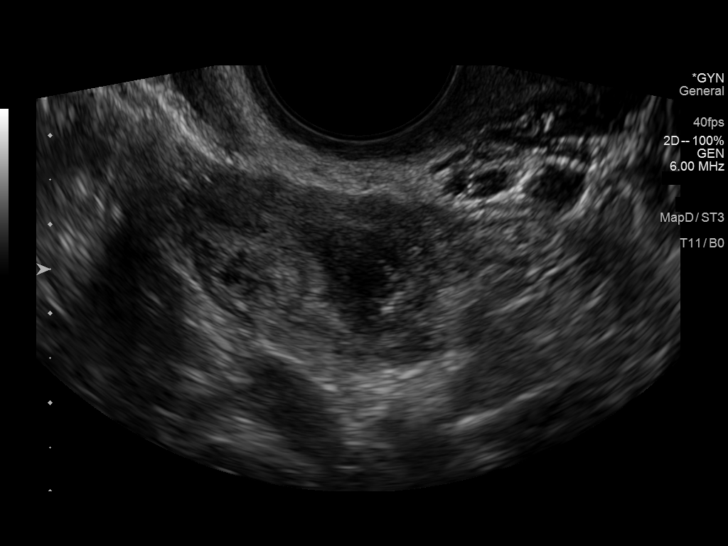
[im 41/55]
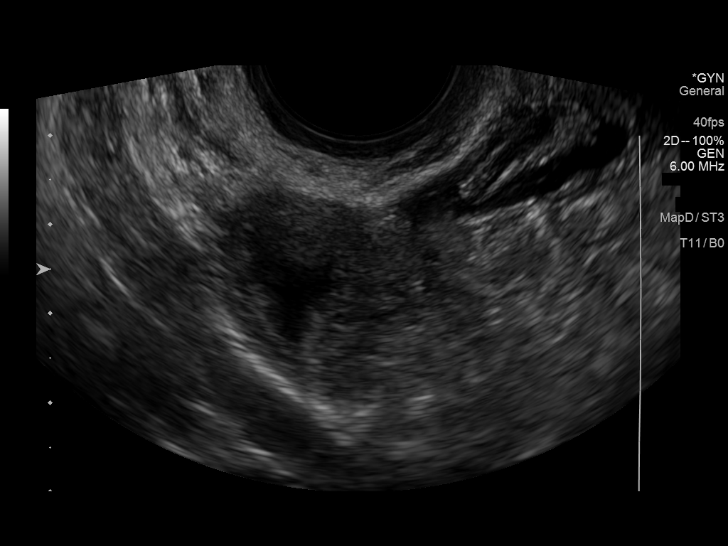
[im 46/55]
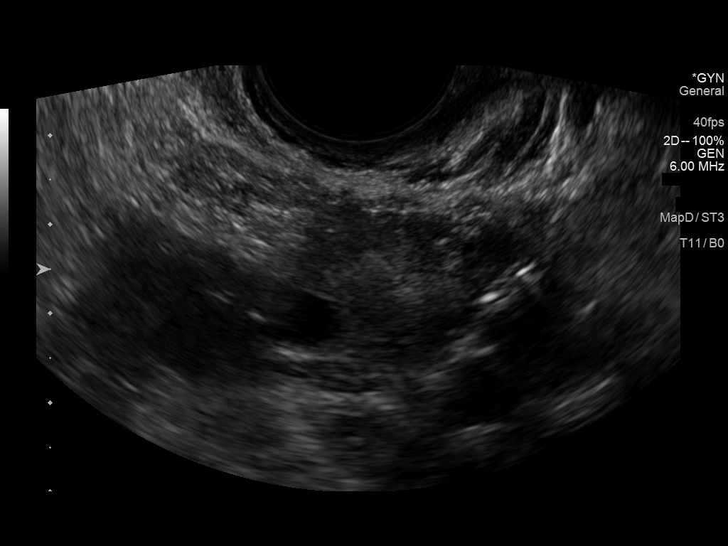
[im 50/55]
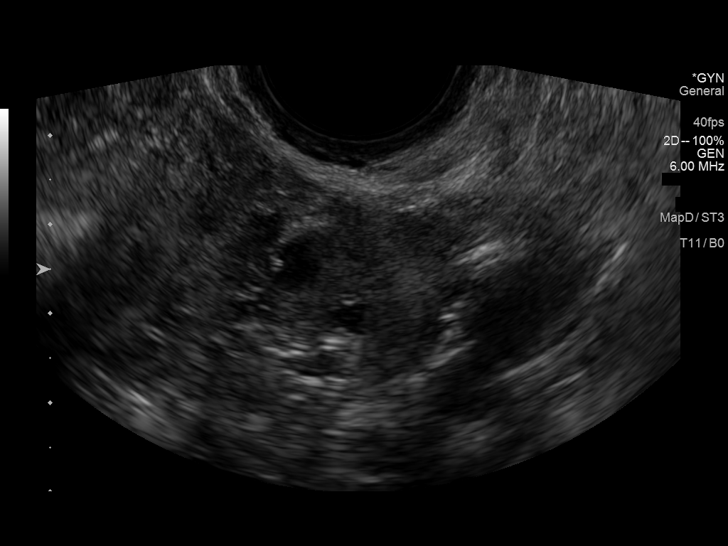
[im 55/55]
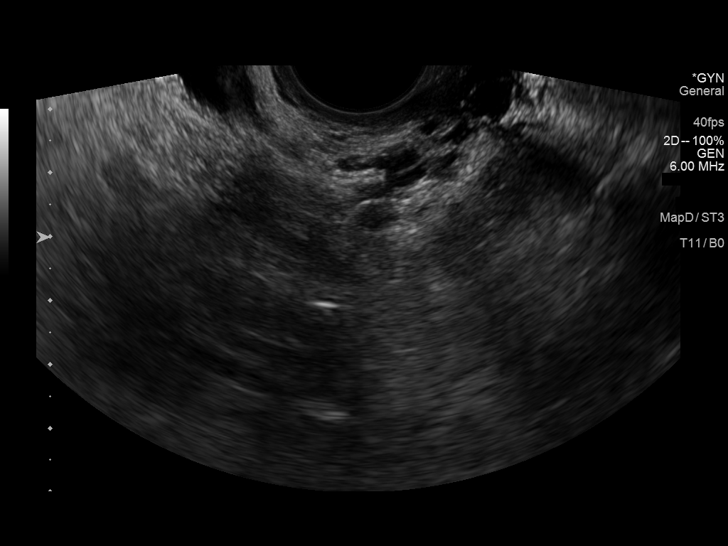

[13 of 25 positions shown; findings below may reference images not displayed]

FINDINGS: Uterus

Measurements: 10.0 x 4.4 x 5.4 cm = volume: 123.0 mL. Uterus is
anteverted. No discrete fibroid or other myometrial abnormality.

Endometrium

Thickness: 9.5 mm.  No focal abnormality visualized.

Right ovary

Measurements: 3.2 x 1.9 x 2.0 cm = volume: 6.0 mL. 1.3 cm dominant
follicle noted. No adnexal mass.

Left ovary

Measurements: 1.5 x 2.1 x 1.8 cm = volume: 3.0 mL. Normal
appearance/no adnexal mass.

Other findings

Trace free fluid within the pelvis, likely physiologic.
IMPRESSION: 1. Normal pelvic ultrasound.
2. Endometrial stripe within normal limits measuring 9.5 mm in
thickness. If bleeding remains unresponsive to hormonal or medical
therapy, sonohysterogram should be considered for focal lesion
work-up. (Ref: Radiological Reasoning: Algorithmic Workup of
Abnormal Vaginal Bleeding with Endovaginal Sonography and
Sonohysterography. AJR 9227; 191:S68-73).

## 2023-10-09 ENCOUNTER — Other Ambulatory Visit

## 2023-10-09 ENCOUNTER — Other Ambulatory Visit (HOSPITAL_COMMUNITY)
Admission: RE | Admit: 2023-10-09 | Discharge: 2023-10-09 | Disposition: A | Discharge status: Home / Self Care | Payer: Self-pay | Source: Ambulatory Visit | Attending: Medical Genetics | Admitting: Medical Genetics

## 2023-10-09 ENCOUNTER — Other Ambulatory Visit: Payer: Self-pay | Admitting: Internal Medicine

## 2023-10-09 DIAGNOSIS — Z006 Encounter for examination for normal comparison and control in clinical research program: Secondary | ICD-10-CM | POA: Insufficient documentation

## 2023-10-12 ENCOUNTER — Other Ambulatory Visit (HOSPITAL_COMMUNITY): Payer: Self-pay

## 2023-10-13 ENCOUNTER — Telehealth: Payer: Self-pay

## 2023-10-13 NOTE — Telephone Encounter (Signed)
 Copied from CRM [id number]. Topic: Clinical - Prescription Issue >> Oct 13, 2023  3:24 PM Adonis Hoot wrote: Reason for CRM: Patient called in stating that she needs prior author for 75 mg of Nurtec needs a PA. Patient stated that the pharmacy has sent a message over to provider as well. Patient is very upset and would just like a PA to be completed for the 75mg  dosage and not 500 mg

## 2023-10-13 NOTE — Telephone Encounter (Signed)
 Noted

## 2023-10-13 NOTE — Telephone Encounter (Signed)
 Called patient and informed her that the PA team is still working on this and as soon as I hear something back I will give her a call.

## 2023-10-13 NOTE — Telephone Encounter (Signed)
 Received a fax from patient's pharmacy asking for a PA for the Rimegepant Sulfate (Nurtec) 75 mg. Patient has also called in stating that pharmacy is waiting to hear back from our office.

## 2023-10-13 NOTE — Telephone Encounter (Signed)
 Called patient and informed her that the PA team is still working on the [employer] and that the medication change was for the Methocarbamol  (Robaxin ) to 500 mg. She stated that she knows that and not sure why they keep saying she is saying anything about a medication dose change.

## 2023-10-13 NOTE — Telephone Encounter (Signed)
 Copied from CRM [id number]. Topic: Referral - Prior Authorization Question >> Oct 13, 2023 11:03 AM Antonieta Kitten wrote: Reason for CRM: Patient waiting for PA, New information was informed that no PA is needed due to the MG being switched to 500MG . Complete >> Oct 13, 2023 12:50 PM Odilia Bennett D wrote: Patient called wanting an update on her nurtec medication and the prior authorization because the medication is $1242 [id number] [phone]

## 2023-10-16 ENCOUNTER — Other Ambulatory Visit (HOSPITAL_COMMUNITY): Payer: Self-pay

## 2023-10-16 ENCOUNTER — Ambulatory Visit: Attending: Nurse Practitioner

## 2023-10-16 ENCOUNTER — Other Ambulatory Visit: Payer: Self-pay

## 2023-10-16 ENCOUNTER — Telehealth: Payer: Self-pay

## 2023-10-16 DIAGNOSIS — M792 Neuralgia and neuritis, unspecified: Secondary | ICD-10-CM | POA: Diagnosis not present

## 2023-10-16 DIAGNOSIS — M5030 Other cervical disc degeneration, unspecified cervical region: Secondary | ICD-10-CM | POA: Insufficient documentation

## 2023-10-16 DIAGNOSIS — M5134 Other intervertebral disc degeneration, thoracic region: Secondary | ICD-10-CM | POA: Diagnosis not present

## 2023-10-16 DIAGNOSIS — M25512 Pain in left shoulder: Secondary | ICD-10-CM | POA: Diagnosis present

## 2023-10-16 DIAGNOSIS — R0789 Other chest pain: Secondary | ICD-10-CM | POA: Insufficient documentation

## 2023-10-16 DIAGNOSIS — M5412 Radiculopathy, cervical region: Secondary | ICD-10-CM | POA: Diagnosis present

## 2023-10-16 DIAGNOSIS — M25612 Stiffness of left shoulder, not elsewhere classified: Secondary | ICD-10-CM | POA: Diagnosis present

## 2023-10-16 DIAGNOSIS — G8929 Other chronic pain: Secondary | ICD-10-CM | POA: Insufficient documentation

## 2023-10-16 NOTE — Telephone Encounter (Signed)
 Pharmacy Patient Advocate Encounter  Received notification from [facility] that Prior Authorization for Nurtec 75MG  dispersible tablets has been APPROVED from 10/16/2023 to 01/16/2024. Ran test claim, Copay is $0. This test claim was processed through [facility]- copay amounts may vary at other pharmacies due to pharmacy/plan contracts, or as the patient moves through the different stages of their insurance plan.  16 tablets per 30 days   PA #/Case ID/Reference #: [id number]

## 2023-10-16 NOTE — Telephone Encounter (Signed)
 Pharmacy Patient Advocate Encounter   Received notification from CoverMyMeds that prior authorization for  Nurtec 75MG  dispersible tablets is required/requested.   Insurance verification completed.   The patient is insured through [facility] .   Per test claim: PA required; PA submitted to above mentioned insurance via CoverMyMeds Key/confirmation #/EOC [id number] Status is pending

## 2023-10-16 NOTE — Telephone Encounter (Signed)
 The orignial prior Siegfried Dress was denied. Lovena Rubinstein made me aware that typically if one triptan was ineffective mostly like the other will be as well, resubmitted prior auth under new note for today's date. I expedited prior auth will update once new approval or denial is expressed.

## 2023-10-16 NOTE — Telephone Encounter (Signed)
 Received a fax from patient's pharmacy for Nurtec 75 mg

## 2023-10-16 NOTE — Telephone Encounter (Signed)
 Pharmacy Patient Advocate Encounter  Received notification from [facility] that Prior Authorization for Nurtec 75MG  dispersible tablets has been DENIED.  Full denial letter will be uploaded to the media tab. See denial reason below.   NURTEC TAB 75MG  ODT is denied for not meeting the prior authorization requirement(s). Details of this decision are in the notice attached below or have been faxed to you.  DENIED because of following question   PA #/Case ID/Reference #: [id number]

## 2023-10-16 NOTE — Therapy (Signed)
 OUTPATIENT PHYSICAL THERAPY CERVICAL EVALUATION   Patient Name: Shannon Marks MRN: [id number] DOB:Sep 21, 1980, 43 y.o., female Today's Date: 10/16/2023  END OF SESSION:  PT End of Session - 10/16/23 1349     Visit Number 1    Date for PT Re-Evaluation 01/08/24    Progress Note Due on Visit 10    PT Start Time 1345    PT Stop Time 1430    PT Time Calculation (min) 45 min    Activity Tolerance Patient tolerated treatment well    Behavior During Therapy 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   Dysmenorrhea 07/09/2021   Encounter for counseling regarding contraception 07/09/2021   Acne cystica 07/09/2021    PCP: Kathrene Parents NP  REFERRING PROVIDER: same  REFERRING DIAG:  M51.34 (ICD-10-CM) - DDD (degenerative disc disease), thoracic  M50.30 (ICD-10-CM) - DDD (degenerative disc disease), cervical  R07.89 (ICD-10-CM) - Left-sided chest wall pain  M79.2 (ICD-10-CM) - Radicular pain in left arm    THERAPY DIAG:  Chronic left shoulder pain  Stiffness of left shoulder, not elsewhere classified  Radiculopathy, cervical region  Rationale for Evaluation and Treatment: Rehabilitation  ONSET DATE: 6 months to a year of progressive Sx  SUBJECTIVE:                                                                                                                                                                                                          SUBJECTIVE STATEMENT: My L shoulder and the L side of my neck hurt all the time. Feels like my arm has gone to sleep, the only way I can relieve it is by propping elbow up and keeping it bent.  Runs from arm up into L neck and down Hand dominance: Right  PERTINENT HISTORY:  Has h/o migraine headaches, and the Sx decribed above. Had tried various medications without relief per pt.  Has a new med prescribed for her migraines but she hasn't obtained due to  not covered by insurance, 1200.00 for this prescription out of pocket  PAIN:  Are you having pain? Yes: NPRS scale: 5 to 9, L shoulder jt ant and post and L upper traps Pain location: see above Pain description: deep pain, burning , tingling Aggravating factors: sleep attempts, donning shirt Relieving factors: none  PRECAUTIONS: None  RED FLAGS: None     WEIGHT BEARING RESTRICTIONS: No  FALLS:  Has patient fallen in last 6 months? 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  DIAGNOSTIC FINDINGS:  DDD c spine, t spine t 4-5 DDD  PATIENT SURVEYS:  Neck Disability Index score: 31 / 50 = 62.0 %  COGNITION: Overall cognitive status: Within functional limits for tasks assessed  SENSATION: WFL, reports deep tingling L upper arm, shoulder jt   POSTURE: maintains L hand, arm cradled in R and against trunk throughout session.  Painful, guarded with donning /doffing shirt for ionto and kinesiotape.  PALPATION: Very tender L  upper traps, L supraspinatus, L ant upper arm over delts insertion, L sub scap   CERVICAL ROM:   Active ROM A/PROM (deg) eval  Flexion 65  Extension wnl  Right lateral flexion   Left lateral flexion   Right rotation 60% with pain  Left rotation    (Blank rows = not tested)  UPPER EXTREMITY ROM:  Active ROM Right eval Left eval  Shoulder flexion  wfl  Shoulder extension    Shoulder abduction  122  Shoulder adduction    Shoulder extension    Shoulder internal rotation Reaches to T8 Reaches to T 8  Shoulder external rotation Greater than 90 65  Elbow flexion    Elbow extension    Wrist flexion    Wrist extension    Wrist ulnar deviation    Wrist radial deviation    Wrist pronation    Wrist supination     (Blank rows = not tested)  UPPER EXTREMITY MMT:  MMT Right eval Left eval  Shoulder flexion  4/5  Shoulder extension    Shoulder abduction    Shoulder adduction    Shoulder extension    Shoulder internal rotation  P! 4-/5  Shoulder external rotation  wnl  Middle trapezius    Lower trapezius    Elbow flexion    Elbow extension    Wrist flexion    Wrist extension    Wrist ulnar deviation    Wrist radial deviation    Wrist pronation    Wrist supination    Grip strength     (Blank rows = not tested)  CERVICAL SPECIAL TESTS:  Cranial cervical flexion test: Positive R rot   TREATMENT DATE: 10/16/23:   eval, POC, iontophoresis over L ant shoulder ant delt insertion, 0.04% dexamethasome, advised to leave on for 4 hrs for 80 mam Kinesiotaping, 2 -I pieces extending from ant and  mid delts to middle  and upper traps, 35% pull  PATIENT EDUCATION:  Education details: POC, goals Person educated: Patient Education method: Explanation, Demonstration, Tactile cues, and Verbal cues Education comprehension: verbalized understanding and needs  further education  HOME EXERCISE PROGRAM: TBD  ASSESSMENT:  CLINICAL IMPRESSION: Patient is a 43 y.o. female who was evaluated today  for physical therapy  for progressive L shoulder and upper traps pain without any obvious trauma.  MMT reveals some pain and weakness for L shoulder IR, otherwise all MMT wfl. Some loss of ROM as well for R cervical spine ROM.  Very tender in multiple sites L shoulder, GH jt line, supraspinatus, biceps long head, sub scapularis, upper traps.  Did not respond well to gentle GH distraction, inf glides , or cervical distraction. Utilized modalities to reduce her pain and inflammation.  May be also developing adhesive capsulitis L shoulder with her loss of ER motion and difficulty sleeping. Would recommend PT intervention with combined modalities, manual techniques, and progressive exercise to assist her with improving her pain and function. Would refer back to MD if no improvement within 6 visits.  OBJECTIVE IMPAIRMENTS: decreased knowledge of condition, decreased ROM, decreased strength, hypomobility, increased fascial restrictions, impaired perceived functional ability, increased muscle spasms, postural dysfunction, and pain.   ACTIVITY LIMITATIONS: carrying, lifting, sleeping, bathing, dressing, reach over head, and caring for others  PARTICIPATION LIMITATIONS: meal prep, cleaning, laundry, driving, shopping, and community activity  PERSONAL FACTORS: Behavior pattern, Past/current experiences, Time since onset of injury/illness/exacerbation, and 1 comorbidity: anxiety are also affecting patient's functional outcome.   REHAB POTENTIAL: Fair    CLINICAL DECISION MAKING: Evolving/moderate complexity  EVALUATION COMPLEXITY: Moderate   GOALS: Goals reviewed with patient? Yes  SHORT TERM GOALS: Target date: 2 weeks, 10/30/23  I HEP Baseline: TBD Goal status: INITIAL   LONG TERM GOALS: Target date: 12/10/24, 8 weeks   NDI improve from 61% to 45% Baseline:   Goal status: INITIAL  2.  Normal, full L shoulder ER, IR ROM and pain free Baseline:reduced 25% compared to R  Goal status: INITIAL  3.  Pain free L shoulder elevation, abd, and flex greater than 130 degrees Baseline: 122 with pain Goal status: INITIAL  4.  Improve sleep , able to sleep without interruption a full night without L shoulder pain Baseline:  Goal status: INITIAL    PLAN:  PT FREQUENCY: 2x/week  PT DURATION: 8 weeks  PLANNED INTERVENTIONS: 97110-Therapeutic exercises, 97530- Therapeutic activity, [id number]- Neuromuscular re-education, 97535- Self Care, 01027- Manual therapy, and 97033- Ionotophoresis 4mg /ml Dexamethasone  PLAN FOR NEXT SESSION: how was iontophoresis, and Kinesiotape?  Progress with pendulum, gentle mobility ex as tolerated, try shoulder pulley, manual stretching c spine   Kourtlyn Charlet L Maris Bena, PT, DPT, OCS 10/16/2023, 6:06 PM

## 2023-10-18 LAB — GENECONNECT MOLECULAR SCREEN: Genetic Analysis Overall Interpretation: NEGATIVE

## 2023-10-18 NOTE — Telephone Encounter (Signed)
 I called patient and left a message for her to return call. Documented in 10/16/23 phone encounter.

## 2023-10-18 NOTE — Telephone Encounter (Signed)
 Left message for patient to return call.

## 2023-10-19 NOTE — Telephone Encounter (Signed)
 I called and spoke with patient and notified her that prior auth of Nurtec has been approved. Patient will call her pharmacy to fill.

## 2023-10-19 NOTE — Telephone Encounter (Unsigned)
 Copied from CRM [id number]. Topic: Clinical - Prescription Issue >> Oct 16, 2023  2:49 PM Antonieta Kitten wrote: Reason for CRM: Calling for the status for Nurtec/resolved >> Oct 18, 2023  5:04 PM [place] J wrote: Patient was returning a call from CAL in regards to her prior authorization status. Unfortunately, I was unable to connect to CAL due to the phones being down at 5:00PM. Luckily, I was able to relay the note that was left that stated that a new prior authorization was submitted. At this time the patient does not have any further questions and would like if someone could call her once an approval or denial is determined.

## 2023-10-20 NOTE — Telephone Encounter (Signed)
 LVM confirming pt was able to pick up Nurtec RX.

## 2023-10-24 ENCOUNTER — Telehealth: Payer: Self-pay

## 2023-10-24 NOTE — Telephone Encounter (Signed)
 Office received a fax from [facility] from patient's pharmacy calling about the Rx for Nurtec saying that a script is needed.

## 2023-10-24 NOTE — Telephone Encounter (Signed)
 Called patient's pharmacy and informed why I was calling. The tech who answered the phone stated that the Nurtec was not on patient's file and that she can take a verbal or that I can resend the Rx. Was about to give a verbal information and the call was disconnected. Called back and spoke with Myeashia and gave verbal information for Nurtec 75 mg to be taken every 3 days  as needed as #30 with no refills.

## 2023-10-24 NOTE — Telephone Encounter (Signed)
 Called patient and left a detailed voice message stating that a verbal order was given for the Nurtec Rx and that her pharmacy-[facility] will be giving her a call for when Rx is ready for pick. Asked that she give the office a call if she has any questions.

## 2023-10-30 ENCOUNTER — Ambulatory Visit

## 2023-10-30 NOTE — Therapy (Deleted)
 OUTPATIENT PHYSICAL THERAPY CERVICAL EVALUATION   Patient Name: Shannon Marks MRN: [id number] DOB:[birth year], 43 y.o., female Today's Date: 10/30/2023  END OF SESSION:    Past Medical History:  Diagnosis Date   Marital conflict 06/25/2021   Past Surgical History:  Procedure Laterality Date   TUBAL LIGATION     Patient Active Problem List   Diagnosis Date Noted   Migraine without aura and without status migrainosus, not intractable 09/05/2023   Left-sided chest wall pain 09/05/2023   Radicular pain in left arm 09/05/2023   SOB (shortness of breath) on exertion 09/05/2023   DDD (degenerative disc disease), thoracic 06/09/2023   DDD (degenerative disc disease), cervical 06/09/2023   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  Rationale for Evaluation and Treatment: Rehabilitation  ONSET DATE: 6 months to a year of progressive Sx  SUBJECTIVE:                                                                                                                                                                                                         SUBJECTIVE STATEMENT: My L shoulder and the L side of my neck hurt all the time. Feels like my arm has gone to sleep, the only way I can relieve it is by propping elbow up and keeping it bent.  Runs from arm up into L neck and down Hand dominance: Right  PERTINENT HISTORY:  Has h/o migraine headaches, and the Sx decribed above. Had tried various medications without relief per pt.  Has a new med  prescribed for her migraines but she hasn't obtained due to not covered by insurance, 1200.00 for this prescription out of pocket  PAIN:  Are you having pain? Yes: NPRS scale: 5 to 9, L shoulder jt ant and post and L upper traps Pain location: see above Pain description: deep pain, burning , tingling Aggravating factors: sleep attempts, donning shirt Relieving factors: none  PRECAUTIONS: None  RED FLAGS: None     WEIGHT BEARING RESTRICTIONS: No  FALLS:  Has patient fallen in last 6 months? 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  DIAGNOSTIC FINDINGS:  DDD c spine, t spine t 4-5 DDD  PATIENT SURVEYS:  Neck Disability Index score: 31 / 50 = 62.0 %  COGNITION: Overall cognitive status: Within functional limits for tasks assessed  SENSATION: WFL, reports deep tingling L upper arm, shoulder jt   POSTURE: maintains L hand, arm cradled in R and against trunk throughout session.  Painful, guarded with donning /doffing shirt for ionto and kinesiotape.  PALPATION: Very tender L upper traps, L supraspinatus, L ant upper arm over delts insertion, L sub scap   CERVICAL ROM:   Active ROM A/PROM (deg) eval  Flexion 65  Extension wnl  Right lateral flexion   Left lateral flexion   Right rotation 60% with pain  Left rotation    (Blank rows = not tested)  UPPER EXTREMITY ROM:  Active ROM Right eval Left eval  Shoulder flexion  wfl  Shoulder extension    Shoulder abduction  122  Shoulder  adduction    Shoulder extension    Shoulder internal rotation Reaches to T8 Reaches to T 8  Shoulder external rotation Greater than 90 65  Elbow flexion    Elbow extension    Wrist flexion    Wrist extension    Wrist ulnar deviation    Wrist radial deviation    Wrist pronation    Wrist supination     (Blank rows = not tested)  UPPER EXTREMITY MMT:  MMT Right eval Left eval  Shoulder flexion  4/5  Shoulder extension    Shoulder abduction    Shoulder adduction    Shoulder extension    Shoulder internal rotation  P! 4-/5  Shoulder external rotation  wnl  Middle trapezius    Lower trapezius    Elbow flexion    Elbow extension    Wrist flexion    Wrist extension    Wrist ulnar deviation    Wrist radial deviation    Wrist pronation    Wrist supination    Grip strength     (Blank rows = not tested)  CERVICAL SPECIAL TESTS:  Cranial cervical flexion test: Positive R rot   TREATMENT DATE: 10/16/23:   eval, POC, iontophoresis over L ant shoulder ant delt insertion, 0.04% dexamethasome, advised to leave on for 4 hrs for 80 mam Kinesiotaping, 2 -I pieces extending from ant and  mid delts to middle  and upper traps, 35% pull  PATIENT EDUCATION:  Education details: POC, goals Person educated: Patient Education method: Explanation, Demonstration, Tactile cues, and Verbal cues Education comprehension: verbalized understanding and needs further education  HOME EXERCISE PROGRAM: TBD  ASSESSMENT:  CLINICAL IMPRESSION: Patient is a 43 y.o. female who was evaluated today  for physical therapy  for progressive L shoulder and upper traps pain without any obvious trauma.  MMT reveals some pain and weakness for L shoulder IR, otherwise all MMT wfl. Some loss of ROM as well for R cervical spine ROM.  Very tender in multiple sites L shoulder, GH jt line,  supraspinatus, biceps long head, sub scapularis, upper traps.  Did not respond well to gentle GH distraction, inf glides , or cervical distraction. Utilized modalities to reduce her pain and inflammation.  May be also developing adhesive capsulitis L shoulder with her loss of ER motion and difficulty sleeping. Would recommend PT intervention with combined modalities, manual techniques, and progressive exercise to assist her with improving her pain and function. Would refer back to MD if no improvement within 6 visits.  OBJECTIVE IMPAIRMENTS: decreased knowledge of condition, decreased ROM, decreased strength, hypomobility, increased fascial restrictions, impaired perceived functional ability, increased muscle spasms, postural dysfunction, and pain.   ACTIVITY LIMITATIONS: carrying, lifting, sleeping, bathing, dressing, reach over head, and caring for others  PARTICIPATION LIMITATIONS: meal prep, cleaning, laundry, driving, shopping, and community activity  PERSONAL FACTORS: Behavior pattern, Past/current experiences, Time since onset of injury/illness/exacerbation, and 1 comorbidity: anxiety are also affecting patient's functional outcome.   REHAB POTENTIAL: Fair    CLINICAL DECISION MAKING: Evolving/moderate complexity  EVALUATION COMPLEXITY: Moderate   GOALS: Goals reviewed with patient? Yes  SHORT TERM GOALS: Target date: 2 weeks, 10/30/23  I HEP Baseline: TBD Goal status: INITIAL   LONG TERM GOALS: Target date: 12/10/24, 8 weeks   NDI improve from 61% to 45% Baseline:  Goal status: INITIAL  2.  Normal, full L shoulder ER, IR ROM and pain free Baseline:reduced 25% compared to R  Goal status: INITIAL  3.  Pain free L shoulder elevation, abd, and flex greater than 130 degrees Baseline: 122 with pain Goal status: INITIAL  4.  Improve sleep , able to sleep without interruption a full night without L shoulder pain Baseline:  Goal status: INITIAL    PLAN:  PT FREQUENCY:  2x/week  PT DURATION: 8 weeks  PLANNED INTERVENTIONS: 97110-Therapeutic exercises, 97530- Therapeutic activity, [id number]- Neuromuscular re-education, 97535- Self Care, 02859- Manual therapy, and 97033- Ionotophoresis 4mg /ml Dexamethasone  PLAN FOR NEXT SESSION: how was iontophoresis, and Kinesiotape?  Progress with pendulum, gentle mobility ex as tolerated, try shoulder pulley, manual stretching c spine   Skyy Mcknight L Daneen Volcy, PT, DPT, OCS 10/30/2023, 1:53 PM

## 2023-11-06 ENCOUNTER — Ambulatory Visit: Attending: Nurse Practitioner

## 2023-11-09 ENCOUNTER — Ambulatory Visit: Admitting: Physical Therapy

## 2023-11-13 ENCOUNTER — Ambulatory Visit

## 2023-11-16 ENCOUNTER — Ambulatory Visit: Admitting: Physical Therapy

## 2023-12-08 ENCOUNTER — Ambulatory Visit: Admitting: Nurse Practitioner

## 2023-12-19 ENCOUNTER — Other Ambulatory Visit (HOSPITAL_COMMUNITY): Payer: Self-pay

## 2023-12-19 ENCOUNTER — Telehealth: Payer: Self-pay

## 2023-12-19 DIAGNOSIS — G43009 Migraine without aura, not intractable, without status migrainosus: Secondary | ICD-10-CM

## 2023-12-19 NOTE — Telephone Encounter (Signed)
 Pharmacy Patient Advocate Encounter  Received notification from [facility] that Prior Authorization for Nurtec 75 has been APPROVED from 12/19/23 to 12/18/25. Ran test claim, Copay is $0.00 for 10 tabs per 30 days. This test claim was processed through [facility]- copay amounts may vary at other pharmacies due to pharmacy/plan contracts, or as the patient moves through the different stages of their insurance plan.   PA #/Case ID/Reference #: # [id number]

## 2023-12-19 NOTE — Telephone Encounter (Signed)
 Pharmacy Patient Advocate Encounter   Received notification from Onbase that prior authorization for Nurtec 75 is due for renewal.   Insurance verification completed.   The patient is insured through [facility].  Action: PA required; PA submitted to above mentioned insurance via Latent Key/confirmation #/EOC [id number] Status is pending

## 2023-12-19 NOTE — Telephone Encounter (Signed)
 Called patient and left a detailed voice message per DPR on file that her Nurtec 75 mg has been approved for [facility] mail order pharmacy and to give me a call if she has any questions about this. I also sent patient a MyChart message.

## 2023-12-22 ENCOUNTER — Other Ambulatory Visit (HOSPITAL_BASED_OUTPATIENT_CLINIC_OR_DEPARTMENT_OTHER): Payer: Self-pay

## 2023-12-22 MED ORDER — NURTEC 75 MG PO TBDP
1.0000 | ORAL_TABLET | ORAL | 0 refills | Status: AC | PRN
Start: 2023-12-22 — End: ?
  Filled 2023-12-22: qty 10, 30d supply, fill #0

## 2023-12-22 MED ORDER — NURTEC 75 MG PO TBDP: 1.0000 | ORAL_TABLET | ORAL | 0 refills | Status: DC | PRN

## 2023-12-22 NOTE — Addendum Note (Signed)
 Addended by: LENON ROUGHEN on: 12/22/2023 03:03 PM   Modules accepted: Orders

## 2023-12-22 NOTE — Addendum Note (Signed)
 Addended by: LENON ROUGHEN on: 12/22/2023 09:53 AM   Modules accepted: Orders

## 2023-12-22 NOTE — Telephone Encounter (Signed)
 MyChart message sent to patient and called patient left a voice message per [facility] detailing that Nurtec 75 mg was sent to [facility] and to give the office a call or respond to MyChart message that was also sent to her with same information if she has any further questions or concerns.

## 2023-12-22 NOTE — Telephone Encounter (Signed)
 Copied from CRM [id number]. Topic: Clinical - Prescription Issue >> Dec 21, 2023  3:58 PM Sasha M wrote: Reason for CRM: Pt has called to find out why [facility] is saying they don't have anything for her medication, Nurtec 75, that was approved on 8/19.

## 2023-12-22 NOTE — Telephone Encounter (Signed)
 Called patient and informed her that per the PA team notification of the PA needed for the medication- Received notification from [facility] that Prior Authorization for Nurtec 75 has been APPROVED from 12/19/23 to 12/18/25. Ran test claim, Copay is $0.00 for 10 tabs per 30 days. This test claim was processed through [facility]- copay amounts may vary at other pharmacies due to pharmacy/plan contracts, or as the patient moves through the different stages of their insurance plan.   She asked if I could try it at a [facility] pharmacy because the $250 is just to much. She also asked if a GoodRx card could be used. I asked which [facility] pharmacy is near her. She stated that she is near [place] and I informed her that I will send the Rx to the [facility] pharmacy. She thanked me and

## 2024-01-02 ENCOUNTER — Other Ambulatory Visit (HOSPITAL_BASED_OUTPATIENT_CLINIC_OR_DEPARTMENT_OTHER): Payer: Self-pay

## 2024-01-08 ENCOUNTER — Ambulatory Visit: Admitting: Nurse Practitioner

## 2024-01-09 ENCOUNTER — Ambulatory Visit: Admitting: Family Medicine

## 2024-01-22 ENCOUNTER — Ambulatory Visit: Admitting: Nurse Practitioner

## 2024-01-31 ENCOUNTER — Encounter: Payer: Self-pay | Admitting: Nurse Practitioner

## 2024-03-22 ENCOUNTER — Other Ambulatory Visit (HOSPITAL_COMMUNITY)
Admission: RE | Admit: 2024-03-22 | Discharge: 2024-03-22 | Disposition: A | Discharge status: Home / Self Care | Payer: Self-pay | Source: Ambulatory Visit | Attending: Nurse Practitioner | Admitting: Nurse Practitioner

## 2024-03-22 ENCOUNTER — Ambulatory Visit: Payer: 59 | Admitting: Nurse Practitioner

## 2024-03-22 ENCOUNTER — Ambulatory Visit: Payer: Self-pay | Admitting: Nurse Practitioner

## 2024-03-22 ENCOUNTER — Telehealth: Payer: Self-pay | Admitting: Nurse Practitioner

## 2024-03-22 VITALS — BP 114/76 | HR 72 | Temp 98.3°F | Ht 62.5 in | Wt 145.4 lb

## 2024-03-22 DIAGNOSIS — Z136 Encounter for screening for cardiovascular disorders: Secondary | ICD-10-CM | POA: Diagnosis not present

## 2024-03-22 DIAGNOSIS — Z23 Encounter for immunization: Secondary | ICD-10-CM

## 2024-03-22 DIAGNOSIS — Z1159 Encounter for screening for other viral diseases: Secondary | ICD-10-CM

## 2024-03-22 DIAGNOSIS — Z0001 Encounter for general adult medical examination with abnormal findings: Secondary | ICD-10-CM

## 2024-03-22 DIAGNOSIS — Z13220 Encounter for screening for lipoid disorders: Secondary | ICD-10-CM

## 2024-03-22 LAB — COMPREHENSIVE METABOLIC PANEL WITH GFR
ALT: 17 U/L (ref 0–35)
AST: 19 U/L (ref 0–37)
Albumin: 4.5 g/dL (ref 3.5–5.2)
Alkaline Phosphatase: 50 U/L (ref 39–117)
BUN: 7 mg/dL (ref 6–23)
CO2: 29 meq/L (ref 19–32)
Calcium: 9.4 mg/dL (ref 8.4–10.5)
Chloride: 102 meq/L (ref 96–112)
Creatinine, Ser: 0.78 mg/dL (ref 0.40–1.20)
GFR: 92.53 mL/min (ref >60.00)
Glucose, Bld: 91 mg/dL (ref 70–99)
Potassium: 4.3 meq/L (ref 3.5–5.1)
Sodium: 138 meq/L (ref 135–145)
Total Bilirubin: 0.7 mg/dL (ref 0.2–1.2)
Total Protein: 7.1 g/dL (ref 6.0–8.3)

## 2024-03-22 LAB — LIPID PANEL
Cholesterol: 192 mg/dL (ref 0–200)
HDL: 71.6 mg/dL (ref >39.00)
LDL Cholesterol: 106 mg/dL — ABNORMAL HIGH (ref 0–99)
NonHDL: 120.89
Total CHOL/HDL Ratio: 3
Triglycerides: 73 mg/dL (ref 0.0–149.0)
VLDL: 14.6 mg/dL (ref 0.0–40.0)

## 2024-03-22 LAB — CBC
HCT: 39.7 % (ref 36.0–46.0)
Hemoglobin: 13.3 g/dL (ref 12.0–15.0)
MCHC: 33.4 g/dL (ref 30.0–36.0)
MCV: 90.6 fl (ref 78.0–100.0)
Platelets: 250 K/uL (ref 150.0–400.0)
RBC: 4.39 Mil/uL (ref 3.87–5.11)
RDW: 14 % (ref 11.5–15.5)
WBC: 7.6 K/uL (ref 4.0–10.5)

## 2024-03-22 NOTE — Telephone Encounter (Signed)
 error

## 2024-03-22 NOTE — Patient Instructions (Signed)
 Go to lab Maintain Heart healthy diet and daily exercise. Schedule nurse visit for 2nd HPV vaccine in 2months and 3rd dose in 6months. Schedule appointment for mobile mammogram.

## 2024-03-22 NOTE — Progress Notes (Signed)
 Complete physical exam  Patient: Shannon Marks   DOB: 10/13/1980   43 y.o. Female  MRN: [id number] Visit Date: 03/22/2024  Subjective:    Chief Complaint  Patient presents with   Annual Exam    FASTING  Wants Flu vaccine  DUE for PAP, Mammogram, HPV vaccine   Shannon Marks is a 43 y.o. female who presents today for a complete physical exam. She reports consuming a general diet. Walking and strength training daily She generally feels well. She reports sleeping well. She does not have additional problems to discuss today.  Vision:Yes Dental:Yes STD Screen:Yes  BP Readings from Last 3 Encounters:  03/22/24 114/76  10/03/23 128/82  09/05/23 124/80   Wt Readings from Last 3 Encounters:  03/22/24 145 lb 6.4 oz (66 kg)  10/03/23 135 lb (61.2 kg)  09/05/23 135 lb (61.2 kg)    Most recent fall risk assessment:    05/11/2023   10:03 AM  Fall Risk   Falls in the past year? 0  Number falls in past yr: 0  Injury with Fall? 0  Risk for fall due to : No Fall Risks  Follow up Falls evaluation completed   Depression screen:Yes - No Depression  Most recent depression screenings:    05/11/2023   10:03 AM 03/22/2023    8:56 AM  PHQ 2/9 Scores  PHQ - 2 Score 0 0    HPI  No problem-specific Assessment & Plan notes found for this encounter.   Past Medical History:  Diagnosis Date   Marital conflict 06/25/2021   Past Surgical History:  Procedure Laterality Date   TUBAL LIGATION     Social History   Socioeconomic History   Marital status: Single    Spouse name: Not on file   Number of children: Not on file   Years of education: Not on file   Highest education level: GED or equivalent  Occupational History   Not on file  Tobacco Use   Smoking status: Never   Smokeless tobacco: Never  Vaping Use   Vaping status: Never Used  Substance and Sexual Activity   Alcohol use: No   Drug use: No   Sexual activity: Yes    Birth control/protection: Surgical  Other  Topics Concern   Not on file  Social History Narrative   Not on file   Social Drivers of Health   Financial Resource Strain: Low Risk  (03/22/2024)   Overall Financial Resource Strain (CARDIA)    Difficulty of Paying Living Expenses: Not hard at all  Food Insecurity: No Food Insecurity (03/22/2024)   Hunger Vital Sign    Worried About Running Out of Food in the Last Year: Never true    Ran Out of Food in the Last Year: Never true  Transportation Needs: No Transportation Needs (03/22/2024)   PRAPARE - Administrator, Civil Service (Medical): No    Lack of Transportation (Non-Medical): No  Physical Activity: Sufficiently Active (03/22/2024)   Exercise Vital Sign    Days of Exercise per Week: 7 days    Minutes of Exercise per Session: 30 min  Stress: No Stress Concern Present (03/22/2024)   [employer] of Occupational Health - Occupational Stress Questionnaire    Feeling of Stress: Not at all  Social Connections: Moderately Isolated (03/22/2024)   Social Connection and Isolation Panel    Frequency of Communication with Friends and Family: More than three times a week    Frequency of Social Gatherings  with Friends and Family: More than three times a week    Attends Religious Services: More than 4 times per year    Active Member of Clubs or Organizations: No    Attends Engineer, Structural: Not on file    Marital Status: Divorced  Intimate Partner Violence: Not on file   No family status information on file.   Family History  Family history unknown: Yes   No Known Allergies  Patient Care Team: Kirubel Aja, Roselie Rockford, NP as PCP - General (Internal Medicine)   Medications: Outpatient Medications Prior to Visit  Medication Sig   Albuterol -Budesonide (AIRSUPRA ) 90-80 MCG/ACT AERO Inhale 2 Inhalations into the lungs every 6 (six) hours as needed.   ibuprofen  (ADVIL ) 600 MG tablet Take 1 tablet (600 mg total) by mouth daily as needed. With food    methocarbamol  (ROBAXIN ) 500 MG tablet Take 1-2 tablets (500-1,000 mg total) by mouth at bedtime as needed for muscle spasms.   promethazine  (PHENERGAN ) 25 MG tablet Take 1 tablet (25 mg total) by mouth every 6 (six) hours as needed for nausea or vomiting.   Rimegepant Sulfate  (NURTEC) 75 MG TBDP Take 1 tablet (75 mg total) by mouth every 3 (three) days as needed.   No facility-administered medications prior to visit.    Review of Systems  Constitutional:  Negative for activity change, appetite change and unexpected weight change.  Respiratory: Negative.    Cardiovascular: Negative.   Gastrointestinal: Negative.   Endocrine: Negative for cold intolerance and heat intolerance.  Genitourinary: Negative.   Musculoskeletal: Negative.   Skin: Negative.   Neurological: Negative.   Hematological: Negative.   Psychiatric/Behavioral:  Negative for behavioral problems, decreased concentration, dysphoric mood, hallucinations, self-injury, sleep disturbance and suicidal ideas. The patient is not nervous/anxious.         Objective:  BP 114/76 (BP Location: Left Arm, Patient Position: Sitting, Cuff Size: Normal)   Pulse 72   Temp 98.3 F (36.8 C) (Oral)   Ht 5' 2.5 (1.588 m)   Wt 145 lb 6.4 oz (66 kg)   LMP 02/12/2024   SpO2 98%   BMI 26.17 kg/m     Physical Exam Vitals and nursing note reviewed.  Constitutional:      General: She is not in acute distress. HENT:     Right Ear: Tympanic membrane, ear canal and external ear normal.     Left Ear: Tympanic membrane, ear canal and external ear normal.     Nose: Nose normal.  Eyes:     Extraocular Movements: Extraocular movements intact.     Conjunctiva/sclera: Conjunctivae normal.     Pupils: Pupils are equal, round, and reactive to light.  Neck:     Thyroid: No thyroid mass, thyromegaly or thyroid tenderness.  Cardiovascular:     Rate and Rhythm: Normal rate and regular rhythm.     Pulses: Normal pulses.     Heart sounds: Normal  heart sounds.  Pulmonary:     Effort: Pulmonary effort is normal.     Breath sounds: Normal breath sounds.  Abdominal:     General: Bowel sounds are normal.     Palpations: Abdomen is soft.  Musculoskeletal:        General: Normal range of motion.     Cervical back: Normal range of motion and neck supple.     Right lower leg: No edema.     Left lower leg: No edema.  Lymphadenopathy:     Cervical: No cervical adenopathy.  Skin:  General: Skin is warm and dry.  Neurological:     Mental Status: She is alert and oriented to person, place, and time.     Cranial Nerves: No cranial nerve deficit.  Psychiatric:        Mood and Affect: Mood normal.        Behavior: Behavior normal.        Thought Content: Thought content normal.      No results found for any visits on 03/22/24.    Assessment & Plan:    Routine Health Maintenance and Physical Exam  Immunization History  Administered Date(s) Administered   Hepb-cpg 01/09/2023, 06/26/2023, 09/08/2023   Influenza Inj Mdck Quad Pf 01/10/2022   Influenza, Mdck, Trivalent,PF 6+ MOS(egg free) 01/06/2023   Influenza,inj,Quad PF,6+ Mos 03/16/2020   Influenza-Unspecified 03/16/2020, 06/25/2021   Janssen (J&J) SARS-COV-2 Vaccination 07/12/2019, 05/04/2020   MMR 06/26/2023, 07/24/2023   Meningococcal Mcv4o 09/08/2023   Moderna Covid-19 Vaccine Bivalent Booster 64yrs & up 01/10/2022   PNEUMOCOCCAL CONJUGATE-20 01/09/2023   PPD Test 07/21/2023   Pfizer(Comirnaty)Fall Seasonal Vaccine 12 years and older 01/06/2023, 07/24/2023   Td 12/05/1995, 09/15/1998   Tdap 03/16/2020   Varicella 06/26/2023, 07/24/2023    Health Maintenance  Topic Date Due   HPV VACCINES (1 - 3-dose SCDM series) Never done   Mammogram  Never done   Cervical Cancer Screening (Pap smear)  08/05/2023   Influenza Vaccine  12/01/2023   COVID-19 Vaccine (6 - 2025-26 season) 04/07/2024 (Originally 01/01/2024)   DTaP/Tdap/Td (4 - Td or Tdap) 03/16/2030   Pneumococcal  Vaccine  Completed   Hepatitis B Vaccines 19-59 Average Risk  Completed   Hepatitis C Screening  Completed   HIV Screening  Completed   Meningococcal B Vaccine  Aged Out   Discussed health benefits of physical activity, and encouraged her to engage in regular exercise appropriate for her age and condition. Advised to schedule annual mammogram  Problem List Items Addressed This Visit   None Visit Diagnoses       Encounter for preventative adult health care exam with abnormal findings    -  Primary   Relevant Orders   CBC   Comprehensive metabolic panel with GFR   Lipid panel     Encounter for screening for viral disease       Relevant Orders   Hepatitis C antibody   HIV Antibody (routine testing w rflx)   Urine cytology ancillary only   RPR W/RFLX TO RPR TITER, TREPONEMAL AB, SCREEN AND DIAGNOSIS     Encounter for lipid screening for cardiovascular disease       Relevant Orders   Lipid panel     Immunization due       Relevant Orders   Flu vaccine trivalent PF, 6mos and older(Flulaval,Afluria,Fluarix,Fluzone)   HPV 9-valent vaccine,Recombinat      Return in about 1 year (around 03/22/2025) for CPE (fasting).     Roselie Mood, NP

## 2024-03-23 LAB — HIV ANTIBODY (ROUTINE TESTING W REFLEX)
HIV 1&2 Ab, 4th Generation: NONREACTIVE
HIV FINAL INTERPRETATION: NEGATIVE

## 2024-03-23 LAB — SYPHILIS: RPR W/REFLEX TO RPR TITER AND TREPONEMAL ANTIBODIES, TRADITIONAL SCREENING AND DIAGNOSIS ALGORITHM: RPR Ser Ql: NONREACTIVE

## 2024-03-23 LAB — HEPATITIS C ANTIBODY: Hepatitis C Ab: NONREACTIVE

## 2024-03-25 LAB — URINE CYTOLOGY ANCILLARY ONLY
Chlamydia: NEGATIVE
Comment: NEGATIVE
Comment: NEGATIVE
Comment: NORMAL
Neisseria Gonorrhea: NEGATIVE
Trichomonas: NEGATIVE

## 2024-04-15 ENCOUNTER — Inpatient Hospital Stay: Admission: RE | Admit: 2024-04-15 | Source: Ambulatory Visit

## 2024-05-21 ENCOUNTER — Ambulatory Visit

## 2024-09-17 ENCOUNTER — Ambulatory Visit

## 2025-03-24 ENCOUNTER — Encounter: Admitting: Nurse Practitioner
